# Patient Record
Sex: Male | Born: 1980 | Race: White | Hispanic: No | Marital: Married | Smoking: Never smoker
Health system: Southern US, Community
[De-identification: ages and names within clinical notes are randomized; demographics above are authoritative.]

## PROBLEM LIST (undated history)

## (undated) DIAGNOSIS — R42 Dizziness and giddiness: Principal | ICD-10-CM

## (undated) DIAGNOSIS — I10 Essential (primary) hypertension: Secondary | ICD-10-CM

## (undated) DIAGNOSIS — K219 Gastro-esophageal reflux disease without esophagitis: Secondary | ICD-10-CM

## (undated) DIAGNOSIS — N200 Calculus of kidney: Secondary | ICD-10-CM

## (undated) HISTORY — DX: Calculus of kidney: N20.0

## (undated) HISTORY — PX: OTHER SURGICAL HISTORY: SHX169

## (undated) HISTORY — DX: Essential (primary) hypertension: I10

## (undated) HISTORY — DX: Gastro-esophageal reflux disease without esophagitis: K21.9

## (undated) HISTORY — DX: Dizziness and giddiness: R42

---

## 2003-03-30 ENCOUNTER — Emergency Department (HOSPITAL_COMMUNITY): Admission: EM | Admit: 2003-03-30 | Discharge: 2003-03-31 | Payer: Self-pay | Admitting: Emergency Medicine

## 2003-03-31 ENCOUNTER — Encounter: Payer: Self-pay | Admitting: Emergency Medicine

## 2003-10-20 ENCOUNTER — Ambulatory Visit (HOSPITAL_COMMUNITY): Admission: RE | Admit: 2003-10-20 | Discharge: 2003-10-20 | Payer: Self-pay | Admitting: Urology

## 2013-04-04 ENCOUNTER — Telehealth: Payer: Self-pay | Admitting: Family Medicine

## 2013-04-04 NOTE — Telephone Encounter (Signed)
appt made

## 2013-04-05 ENCOUNTER — Ambulatory Visit (INDEPENDENT_AMBULATORY_CARE_PROVIDER_SITE_OTHER): Payer: BC Managed Care – PPO | Admitting: Family Medicine

## 2013-04-05 ENCOUNTER — Ambulatory Visit (INDEPENDENT_AMBULATORY_CARE_PROVIDER_SITE_OTHER): Payer: BC Managed Care – PPO

## 2013-04-05 VITALS — BP 139/88 | HR 63 | Temp 98.4°F | Wt 223.6 lb

## 2013-04-05 DIAGNOSIS — S4980XA Other specified injuries of shoulder and upper arm, unspecified arm, initial encounter: Secondary | ICD-10-CM

## 2013-04-05 DIAGNOSIS — M25511 Pain in right shoulder: Secondary | ICD-10-CM

## 2013-04-05 DIAGNOSIS — S4991XA Unspecified injury of right shoulder and upper arm, initial encounter: Secondary | ICD-10-CM

## 2013-04-05 DIAGNOSIS — M25519 Pain in unspecified shoulder: Secondary | ICD-10-CM

## 2013-04-05 MED ORDER — IBUPROFEN 600 MG PO TABS: 600.0000 mg | ORAL_TABLET | Freq: Three times a day (TID) | ORAL | Status: DC | PRN

## 2013-04-05 NOTE — Progress Notes (Signed)
  Subjective:    Patient ID: Lutricia Horsfall, male    DOB: [birth year], 32 y.o.   MRN: [id number]  HPI  This 32 y.o. male presents for evaluation of right shoulder discomfort for a month.  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  He denies injury.  Review of Systems C/o right shoulder pain.  No chest pain, SOB, HA, dizziness, vision change, N/V, diarrhea, constipation, dysuria, urinary urgency or frequency, or rash.     Objective:   Physical Exam  Vital signs noted  Well developed well nourished male.  HEENT - Head atraumatic Normocephalic                Respiratory - Lungs CTA bilateral Cardiac - RRR S1 and S2 without murmur MS - Tenderness right shoulder at acromium,  Tenderness with external and internal rotation and FROM and strength right shoulder. Extremities - No edema. Neuro - Grossly intact.  Preliminary right shoulder xray interpretation - Normal right shoulder.      Assessment & Plan:  Shoulder injury, right, initial encounter - Plan: DG Shoulder Right - Motrin 600mg  one po tid x 10 days #30,  Explained he probably has bursitis and rotator cuff tendonitis.  Follow up in 2 weeks if not better and discussed doing circumduction exercises bid.

## 2013-06-20 ENCOUNTER — Ambulatory Visit: Payer: BC Managed Care – PPO | Admitting: Family Medicine

## 2014-12-27 ENCOUNTER — Telehealth: Payer: Self-pay | Admitting: Family Medicine

## 2014-12-27 NOTE — Telephone Encounter (Signed)
Stp advised we don't have any openings for today, can go to urgent care or call back at 7:45 in the morning to see if we have any cancellations. Pt voiced understanding.

## 2014-12-29 ENCOUNTER — Ambulatory Visit (INDEPENDENT_AMBULATORY_CARE_PROVIDER_SITE_OTHER): Payer: BLUE CROSS/BLUE SHIELD | Admitting: Physician Assistant

## 2014-12-29 ENCOUNTER — Encounter: Payer: Self-pay | Admitting: Physician Assistant

## 2014-12-29 VITALS — BP 141/92 | HR 66 | Temp 97.4°F | Ht 67.0 in | Wt 230.6 lb

## 2014-12-29 DIAGNOSIS — K219 Gastro-esophageal reflux disease without esophagitis: Secondary | ICD-10-CM | POA: Diagnosis not present

## 2014-12-29 MED ORDER — OMEPRAZOLE 20 MG PO CPDR: 20.0000 mg | DELAYED_RELEASE_CAPSULE | Freq: Every day | ORAL | Status: DC

## 2014-12-29 MED ORDER — MECLIZINE HCL 32 MG PO TABS: 32.0000 mg | ORAL_TABLET | Freq: Three times a day (TID) | ORAL | Status: DC | PRN

## 2014-12-29 NOTE — Patient Instructions (Signed)
Exercise to Lose Weight Exercise and a healthy diet may help you lose weight. Your doctor may suggest specific exercises. EXERCISE IDEAS AND TIPS  Choose low-cost things you enjoy doing, such as walking, bicycling, or exercising to workout videos.  Take stairs instead of the elevator.  Walk during your lunch break.  Park your car further away from work or school.  Go to a gym or an exercise class.  Start with 5 to 10 minutes of exercise each day. Build up to 30 minutes of exercise 4 to 6 days a week.  Wear shoes with good support and comfortable clothes.  Stretch before and after working out.  Work out until you breathe harder and your heart beats faster.  Drink extra water when you exercise.  Do not do so much that you hurt yourself, feel dizzy, or get very short of breath. Exercises that burn about 150 calories:  Running 1  miles in 15 minutes.  Playing volleyball for 45 to 60 minutes.    MONITOR BLOOD PRESSURE IN AM UPON WAKING AND AT 6 PM IN THE EVENING. WRITE READINGS DOWN AND BRING THEM WITH YOU AT FOLLOW UP. dRINK WATER, NO SOFT DRINKS  IF S/S WORSEN , REPORT TO THE er  Washing and waxing a car for 45 to 60 minutes.  Playing touch football for 45 minutes.  Walking 1  miles in 35 minutes.  Pushing a stroller 1  miles in 30 minutes.  Playing basketball for 30 minutes.  Raking leaves for 30 minutes.  Bicycling 5 miles in 30 minutes.  Walking 2 miles in 30 minutes.  Dancing for 30 minutes.  Shoveling snow for 15 minutes.  Swimming laps for 20 minutes.  Walking up stairs for 15 minutes.  Bicycling 4 miles in 15 minutes.  Gardening for 30 to 45 minutes.  Jumping rope for 15 minutes.  Washing windows or floors for 45 to 60 minutes. Document Released: 11/01/2010 Document Revised: 12/22/2011 Document Reviewed: 11/01/2010 [facility] Patient Information 2015 [place], [place]. This information is not intended to replace advice given to you by your  health care provider. Make sure you discuss any questions you have with your health care provider. Calorie Counting for Weight Loss Calories are energy you get from the things you eat and drink. Your body uses this energy to keep you going throughout the day. The number of calories you eat affects your weight. When you eat more calories than your body needs, your body stores the extra calories as fat. When you eat fewer calories than your body needs, your body burns fat to get the energy it needs. Calorie counting means keeping track of how many calories you eat and drink each day. If you make sure to eat fewer calories than your body needs, you should lose weight. In order for calorie counting to work, you will need to eat the number of calories that are right for you in a day to lose a healthy amount of weight per week. A healthy amount of weight to lose per week is usually 1-2 lb (0.5-0.9 kg). A dietitian can determine how many calories you need in a day and give you suggestions on how to reach your calorie goal.  WHAT IS MY MY PLAN? My goal is to have __________ calories per day.  If I have this many calories per day, I should lose around __________ pounds per week. WHAT DO I NEED TO KNOW ABOUT CALORIE COUNTING? In order to meet your daily calorie goal, you  will need to:  Find out how many calories are in each food you would like to eat. Try to do this before you eat.  Decide how much of the food you can eat.  Write down what you ate and how many calories it had. Doing this is called keeping a food log. WHERE DO I FIND CALORIE INFORMATION? The number of calories in a food can be found on a Nutrition Facts label. Note that all the information on a label is based on a specific serving of the food. If a food does not have a Nutrition Facts label, try to look up the calories online or ask your dietitian for help. HOW DO I DECIDE HOW MUCH TO EAT? To decide how much of the food you can eat, you will  need to consider both the number of calories in one serving and the size of one serving. This information can be found on the Nutrition Facts label. If a food does not have a Nutrition Facts label, look up the information online or ask your dietitian for help. Remember that calories are listed per serving. If you choose to have more than one serving of a food, you will have to multiply the calories per serving by the amount of servings you plan to eat. For example, the label on a package of bread might say that a serving size is 1 slice and that there are 90 calories in a serving. If you eat 1 slice, you will have eaten 90 calories. If you eat 2 slices, you will have eaten 180 calories. HOW DO I KEEP A FOOD LOG? After each meal, record the following information in your food log:  What you ate.  How much of it you ate.  How many calories it had.  Then, add up your calories. Keep your food log near you, such as in a small notebook in your pocket. Another option is to use a mobile app or website. Some programs will calculate calories for you and show you how many calories you have left each time you add an item to the log. WHAT ARE SOME CALORIE COUNTING TIPS?  Use your calories on foods and drinks that will fill you up and not leave you hungry. Some examples of this include foods like nuts and nut butters, vegetables, lean proteins, and high-fiber foods (more than 5 g fiber per serving).  Eat nutritious foods and avoid empty calories. Empty calories are calories you get from foods or beverages that do not have many nutrients, such as candy and soda. It is better to have a nutritious high-calorie food (such as an avocado) than a food with few nutrients (such as a bag of chips).  Know how many calories are in the foods you eat most often. This way, you do not have to look up how many calories they have each time you eat them.  Look out for foods that may seem like low-calorie foods but are really  high-calorie foods, such as baked goods, soda, and fat-free candy.  Pay attention to calories in drinks. Drinks such as sodas, specialty coffee drinks, alcohol, and juices have a lot of calories yet do not fill you up. Choose low-calorie drinks like water and diet drinks.  Focus your calorie counting efforts on higher calorie items. Logging the calories in a garden salad that contains only vegetables is less important than calculating the calories in a milk shake.  Find a way of tracking calories that works for you.  Get creative. Most people who are successful find ways to keep track of how much they eat in a day, even if they do not count every calorie. WHAT ARE SOME PORTION CONTROL TIPS?  Know how many calories are in a serving. This will help you know how many servings of a certain food you can have.  Use a measuring cup to measure serving sizes. This is helpful when you start out. With time, you will be able to estimate serving sizes for some foods.  Take some time to put servings of different foods on your favorite plates, bowls, and cups so you know what a serving looks like.  Try not to eat straight from a bag or box. Doing this can lead to overeating. Put the amount you would like to eat in a cup or on a plate to make sure you are eating the right portion.  Use smaller plates, glasses, and bowls to prevent overeating. This is a quick and easy way to practice portion control. If your plate is smaller, less food can fit on it.  Try not to multitask while eating, such as watching TV or using your computer. If it is time to eat, sit down at a table and enjoy your food. Doing this will help you to start recognizing when you are full. It will also make you more aware of what and how much you are eating. HOW CAN I CALORIE COUNT WHEN EATING OUT?  Ask for smaller portion sizes or child-sized portions.  Consider sharing an entree and sides instead of getting your own entree.  If you get your  own entree, eat only half. Ask for a box at the beginning of your meal and put the rest of your entree in it so you are not tempted to eat it.  Look for the calories on the menu. If calories are listed, choose the lower calorie options.  Choose dishes that include vegetables, fruits, whole grains, low-fat dairy products, and lean protein. Focusing on smart food choices from each of the 5 food groups can help you stay on track at restaurants.  Choose items that are boiled, broiled, grilled, or steamed.  Choose water, milk, unsweetened iced tea, or other drinks without added sugars. If you want an alcoholic beverage, choose a lower calorie option. For example, a regular margarita can have up to 700 calories and a glass of wine has around 150.  Stay away from items that are buttered, battered, fried, or served with cream sauce. Items labeled "crispy" are usually fried, unless stated otherwise.  Ask for dressings, sauces, and syrups on the side. These are usually very high in calories, so do not eat much of them.  Watch out for salads. Many people think salads are a healthy option, but this is often not the case. Many salads come with bacon, fried chicken, lots of cheese, fried chips, and dressing. All of these items have a lot of calories. If you want a salad, choose a garden salad and ask for grilled meats or steak. Ask for the dressing on the side, or ask for olive oil and vinegar or lemon to use as dressing.  Estimate how many servings of a food you are given. For example, a serving of cooked rice is  cup or about the size of half a tennis ball or one cupcake wrapper. Knowing serving sizes will help you be aware of how much food you are eating at restaurants. The list below tells you how big  or small some common portion sizes are based on everyday objects.  1 oz--4 stacked dice.  3 oz--1 deck of cards.  1 tsp--1 dice.  1 Tbsp-- a Ping-Pong ball.  2 Tbsp--1 Ping-Pong ball.   cup--1  tennis ball or 1 cupcake wrapper.  1 cup--1 baseball. Document Released: 09/29/2005 Document Revised: 02/13/2014 Document Reviewed: 08/04/2013 [facility] Patient Information 2015 [place], [place]. This information is not intended to replace advice given to you by your health care provider. Make sure you discuss any questions you have with your health care provider.

## 2014-12-29 NOTE — Progress Notes (Signed)
Subjective:     Patient ID: William Mack, male   DOB: 1981-07-28, 34 y.o.   MRN: [id number]  HPI 34 y/o obese male presetns with c/o light headed and dizziness that began with one episode on Tuesday when walking at work and an episode Wednesday while sitting. Has occurred minimally since those episodes. Went to Urgent care and was diagnosed with slightly elevated hypertension. BP today is borderline hypertensive. He can not relate to whether the dizziness occurs more with sitting to standing or sudden head movements. Family hx of HTN with parents.   Review of Systems  Constitutional: Positive for fatigue.  Respiratory: Negative.   Cardiovascular: Negative.   Neurological: Positive for dizziness and light-headedness. Negative for tremors, seizures, syncope, speech difficulty, weakness and numbness.  Psychiatric/Behavioral: Negative.        Objective:   Physical Exam  Constitutional: He appears well-developed and well-nourished. No distress.  [facility] CBC and CMP were WNL.   obese  HENT:  Right Ear: External ear normal.  Left Ear: External ear normal.  Eyes: Pupils are equal, round, and reactive to light.  Neck: Normal range of motion. Neck supple. No thyromegaly present.  Cardiovascular: Normal rate, regular rhythm and normal heart sounds.  Exam reveals no gallop and no friction rub.   No murmur heard. Pulmonary/Chest: Effort normal and breath sounds normal. No respiratory distress. He has no wheezes. He has no rales. He exhibits no tenderness.  Lymphadenopathy:    He has no cervical adenopathy.  Skin: He is not diaphoretic.  Vitals reviewed.      Assessment:    1. Vertigo 2. Type 1 Hypertension 3. GERD    Plan:     1. Meclizine TID prn for dizziness until f/u in 2 weeks.  2. Borderline hypertensive. Attempt treatment with exercise and weight loss. Replace soft drinks with water. No Gatorade. Grilled foods. Attempt cardio  exercise 20 min daily. Monitor BP at home in am and at 6pm in evening. Recheck in 2 weeks to determine if antihypertensive is needed.  3. Omeprazole 20mg  daily

## 2015-01-15 ENCOUNTER — Ambulatory Visit: Payer: BLUE CROSS/BLUE SHIELD | Admitting: Physician Assistant

## 2015-02-02 ENCOUNTER — Ambulatory Visit: Payer: BLUE CROSS/BLUE SHIELD | Admitting: Physician Assistant

## 2015-02-09 ENCOUNTER — Ambulatory Visit (INDEPENDENT_AMBULATORY_CARE_PROVIDER_SITE_OTHER): Payer: BLUE CROSS/BLUE SHIELD | Admitting: Physician Assistant

## 2015-02-09 ENCOUNTER — Encounter: Payer: Self-pay | Admitting: Physician Assistant

## 2015-02-09 VITALS — BP 143/83 | HR 62 | Temp 97.8°F | Ht 67.0 in | Wt 226.0 lb

## 2015-02-09 DIAGNOSIS — H8110 Benign paroxysmal vertigo, unspecified ear: Secondary | ICD-10-CM

## 2015-02-09 DIAGNOSIS — K219 Gastro-esophageal reflux disease without esophagitis: Secondary | ICD-10-CM | POA: Diagnosis not present

## 2015-02-09 MED ORDER — MECLIZINE HCL 32 MG PO TABS: 32.0000 mg | ORAL_TABLET | Freq: Three times a day (TID) | ORAL | Status: DC | PRN

## 2015-02-09 MED ORDER — OMEPRAZOLE 20 MG PO CPDR: 20.0000 mg | DELAYED_RELEASE_CAPSULE | Freq: Every day | ORAL | Status: DC

## 2015-02-09 NOTE — Patient Instructions (Signed)
Exercise to Lose Weight Exercise and a healthy diet may help you lose weight. Your doctor may suggest specific exercises. EXERCISE IDEAS AND TIPS  Choose low-cost things you enjoy doing, such as walking, bicycling, or exercising to workout videos.  Take stairs instead of the elevator.  Walk during your lunch break.  Park your car further away from work or school.  Go to a gym or an exercise class.  Start with 5 to 10 minutes of exercise each day. Build up to 30 minutes of exercise 4 to 6 days a week.  Wear shoes with good support and comfortable clothes.  Stretch before and after working out.  Work out until you breathe harder and your heart beats faster.  Drink extra water when you exercise.  Do not do so much that you hurt yourself, feel dizzy, or get very short of breath. Exercises that burn about 150 calories:  Running 1  miles in 15 minutes.  Playing volleyball for 45 to 60 minutes.  Washing and waxing a car for 45 to 60 minutes.  Playing touch football for 45 minutes.  Walking 1  miles in 35 minutes.  Pushing a stroller 1  miles in 30 minutes.  Playing basketball for 30 minutes.  Raking leaves for 30 minutes.  Bicycling 5 miles in 30 minutes.  Walking 2 miles in 30 minutes.  Dancing for 30 minutes.  Shoveling snow for 15 minutes.  Swimming laps for 20 minutes.  Walking up stairs for 15 minutes.  Bicycling 4 miles in 15 minutes.  Gardening for 30 to 45 minutes.  Jumping rope for 15 minutes.  Washing windows or floors for 45 to 60 minutes. Document Released: 11/01/2010 Document Revised: 12/22/2011 Document Reviewed: 11/01/2010 [employer] Patient Information 2015 [employer]. This information is not intended to replace advice given to you by your health care provider. Make sure you discuss any questions you have with your health care provider.  

## 2015-02-09 NOTE — Progress Notes (Signed)
   Subjective:    Patient ID: William Mack, male    DOB: 1981/03/22, 34 y.o.   MRN: [id number]  HPI 34 y/o presents for f/u of dizziness and GERD. He states that he's had much improvement with tx of MeclizineBID and Omeprazole daily. He has been taking his BP BID and has records today    Review of Systems  Neurological: Positive for dizziness (decreased episodes, approx once weekly ).  All other systems reviewed and are negative.      Objective:   Physical Exam  Constitutional: He is oriented to person, place, and time. He appears well-developed and well-nourished. No distress.  Obese    HENT:  Head: Normocephalic.  Cardiovascular: Normal rate, regular rhythm, normal heart sounds and intact distal pulses.  Exam reveals no gallop and no friction rub.   No murmur heard. Pulmonary/Chest: Effort normal and breath sounds normal. No respiratory distress. He has no wheezes. He has no rales. He exhibits no tenderness.  Neurological: He is alert and oriented to person, place, and time.  Skin: He is not diaphoretic.  Psychiatric: He has a normal mood and affect. His behavior is normal. Judgment and thought content normal.  Vitals reviewed.         Assessment & Plan:  1. Benign paroxysmal positional vertigo, unspecified laterality  - meclizine (ANTIVERT) 32 MG tablet; Take 1 tablet (32 mg total) by mouth 3 (three) times daily as needed for dizziness.  Dispense: 30 tablet; Refill: 1  2. Gastroesophageal reflux disease without esophagitis  - omeprazole (PRILOSEC) 20 MG capsule; Take 1 capsule (20 mg total) by mouth daily.  Dispense: 30 capsule; Refill: 7  Info given to encourage weight loss and diet to prevent HTN.    Continue all meds Labs pending Health Maintenance reviewed Diet and exercise encouraged   Tiffany A. Chauncey ReadingGann PA-C

## 2015-02-20 ENCOUNTER — Other Ambulatory Visit: Payer: Self-pay | Admitting: Physician Assistant

## 2015-05-10 ENCOUNTER — Other Ambulatory Visit: Payer: Self-pay | Admitting: Physician Assistant

## 2015-05-10 DIAGNOSIS — H8110 Benign paroxysmal vertigo, unspecified ear: Secondary | ICD-10-CM

## 2015-05-10 MED ORDER — MECLIZINE HCL 25 MG PO TABS: 25.0000 mg | ORAL_TABLET | Freq: Three times a day (TID) | ORAL | Status: DC | PRN

## 2015-05-10 NOTE — Telephone Encounter (Signed)
Prescription sent to pharmacy  Raliyah Montella A. Renato Spellman PA-C   

## 2015-06-01 DIAGNOSIS — N200 Calculus of kidney: Secondary | ICD-10-CM

## 2015-06-01 HISTORY — DX: Calculus of kidney: N20.0

## 2015-06-05 ENCOUNTER — Ambulatory Visit (INDEPENDENT_AMBULATORY_CARE_PROVIDER_SITE_OTHER): Payer: BLUE CROSS/BLUE SHIELD | Admitting: Family Medicine

## 2015-06-05 ENCOUNTER — Encounter: Payer: Self-pay | Admitting: Family Medicine

## 2015-06-05 VITALS — BP 152/92 | HR 68 | Temp 98.4°F | Ht 69.0 in | Wt 224.0 lb

## 2015-06-05 DIAGNOSIS — H8110 Benign paroxysmal vertigo, unspecified ear: Secondary | ICD-10-CM | POA: Insufficient documentation

## 2015-06-05 DIAGNOSIS — K219 Gastro-esophageal reflux disease without esophagitis: Secondary | ICD-10-CM | POA: Insufficient documentation

## 2015-06-05 DIAGNOSIS — N200 Calculus of kidney: Secondary | ICD-10-CM

## 2015-06-05 DIAGNOSIS — R319 Hematuria, unspecified: Secondary | ICD-10-CM

## 2015-06-05 LAB — POCT URINALYSIS DIPSTICK
BILIRUBIN UA: NEGATIVE
GLUCOSE UA: NEGATIVE
KETONES UA: NEGATIVE
Leukocytes, UA: NEGATIVE
Nitrite, UA: NEGATIVE
Protein, UA: NEGATIVE
SPEC GRAV UA: 1.01
Urobilinogen, UA: NEGATIVE
pH, UA: 7

## 2015-06-05 LAB — POCT UA - MICROSCOPIC ONLY
BACTERIA, U MICROSCOPIC: NEGATIVE
CASTS, UR, LPF, POC: NEGATIVE
CRYSTALS, UR, HPF, POC: NEGATIVE
EPITHELIAL CELLS, URINE PER MICROSCOPY: NEGATIVE
MUCUS UA: NEGATIVE
RBC, URINE, MICROSCOPIC: NEGATIVE
Yeast, UA: NEGATIVE

## 2015-06-05 NOTE — Patient Instructions (Signed)
Ureteral Colic (Kidney Stones) °Ureteral colic is the result of a condition when kidney stones form inside the kidney. Once kidney stones are formed they may move into the tube that connects the kidney with the bladder (ureter). If this occurs, this condition may cause pain (colic) in the ureter.  °CAUSES  °Pain is caused by stone movement in the ureter and the obstruction caused by the stone. °SYMPTOMS  °The pain comes and goes as the ureter contracts around the stone. The pain is usually intense, sharp, and stabbing in character. The location of the pain may move as the stone moves through the ureter. When the stone is near the kidney the pain is usually located in the back and radiates to the belly (abdomen). When the stone is ready to pass into the bladder the pain is often located in the lower abdomen on the side the stone is located. At this location, the symptoms may mimic those of a urinary tract infection with urinary frequency. Once the stone is located here it often passes into the bladder and the pain disappears completely. °TREATMENT  °· Your caregiver will provide you with medicine for pain relief. °· You may require specialized follow-up X-rays. °· The absence of pain does not always mean that the stone has passed. It may have just stopped moving. If the urine remains completely obstructed, it can cause loss of kidney function or even complete destruction of the involved kidney. It is your responsibility and in your interest that X-rays and follow-ups as suggested by your caregiver are completed. Relief of pain without passage of the stone can be associated with severe damage to the kidney, including loss of kidney function on that side. °· If your stone does not pass on its own, additional measures may be taken by your caregiver to ensure its removal. °HOME CARE INSTRUCTIONS  °· Increase your fluid intake. Water is the preferred fluid since juices containing vitamin C may acidify the urine making it  less likely for certain stones (uric acid stones) to pass. °· Strain all urine. A strainer will be provided. Keep all particulate matter or stones for your caregiver to inspect. °· Take your pain medicine as directed. °· Make a follow-up appointment with your caregiver as directed. °· Remember that the goal is passage of your stone. The absence of pain does not mean the stone is gone. Follow your caregiver's instructions. °· Only take over-the-counter or prescription medicines for pain, discomfort, or fever as directed by your caregiver. °SEEK MEDICAL CARE IF:  °· Pain cannot be controlled with the prescribed medicine. °· You have a fever. °· Pain continues for longer than your caregiver advises it should. °· There is a change in the pain, and you develop chest discomfort or constant abdominal pain. °· You feel faint or pass out. °MAKE SURE YOU:  °· Understand these instructions. °· Will watch your condition. °· Will get help right away if you are not doing well or get worse. °Document Released: 07/09/2005 Document Revised: 01/24/2013 Document Reviewed: 03/26/2011 °[employer]® Patient Information ©2015 [employer]. This information is not intended to replace advice given to you by your health care provider. Make sure you discuss any questions you have with your health care provider. ° °

## 2015-06-05 NOTE — Progress Notes (Signed)
BP 152/92 mmHg  Pulse 68  Temp(Src) 98.4 F (36.9 C) (Oral)  Ht  (1.753 m)  Wt 224 lb (101.606 kg)  BMI 33.06 kg/m2   Subjective:    Patient ID: William Mack, male    DOB: Aug 18, 1981, 34 y.o.   MRN: [id number]  HPI: William Mack is a 34 y.o. male presenting on 06/05/2015 for Hospital followup   HPI Renal stones Patient was at [facility] 3 days ago on Saturday. He was diagnosed with renal stones at that point. They told him he had 2 renal stones largest being 5 mm. He brings in an a cup today 2 small stones in a jar. He had a lot of issues on 02/17/23 and last night, he passed one stone on 2023/02/17 morning and one stone yesterday morning. He has been feeling a lot better today so far. He has increased his fluid intake, and decreased his soda intake. He denies eating much chocolate, denies eating too much red meats. He does have a urology appointment this coming month and was told to bring the stones to the urologist so he will not take the stones and sent for analysis just yet. He is taking Flomax and ibuprofen. Denies any visible hematuria but did have microscopic per lab values. Pain is 2 out of 10 today and was as high as 8 out of 10 last night the day before.  Relevant past medical, surgical, family and social history reviewed and updated as indicated. Interim medical history since our last visit reviewed. Allergies and medications reviewed and updated.  Review of Systems  Constitutional: Negative for fever and chills.  HENT: Negative for ear discharge and ear pain.   Eyes: Negative for discharge and visual disturbance.  Respiratory: Negative for shortness of breath and wheezing.   Cardiovascular: Negative for chest pain and leg swelling.  Gastrointestinal: Positive for nausea (improved today) and abdominal pain. Negative for vomiting, diarrhea, constipation and abdominal distention.  Genitourinary: Positive for hematuria (microscopic but not macroscopic) and  flank pain. Negative for difficulty urinating.  Musculoskeletal: Negative for back pain and gait problem.  Skin: Negative for rash.  Neurological: Negative for syncope, light-headedness and headaches.  All other systems reviewed and are negative.   Per HPI unless specifically indicated above     Medication List       This list is accurate as of: 06/05/15 10:37 AM.  Always use your most recent med list.               ibuprofen 600 MG tablet  Commonly known as:  ADVIL,MOTRIN  Take 1 tablet (600 mg total) by mouth every 8 (eight) hours as needed for pain.     ibuprofen 800 MG tablet  Commonly known as:  ADVIL,MOTRIN  Take 800 mg by mouth every 6 (six) hours as needed.     meclizine 25 MG tablet  Commonly known as:  ANTIVERT  TAKE ONE TABLET BY MOUTH THREE TIMES DAILY AS NEEDED FOR DIZZINESS     omeprazole 20 MG capsule  Commonly known as:  PRILOSEC  Take 1 capsule (20 mg total) by mouth daily.     oxyCODONE-acetaminophen 5-325 MG per tablet  Commonly known as:  PERCOCET/ROXICET  Take 1 tablet by mouth every 6 (six) hours as needed for severe pain.     promethazine 25 MG tablet  Commonly known as:  PHENERGAN  Take 25 mg by mouth every 6 (six) hours as needed for nausea or vomiting.  tamsulosin 0.4 MG Caps capsule  Commonly known as:  FLOMAX  Take 0.4 mg by mouth daily.           Objective:    BP 152/92 mmHg  Pulse 68  Temp(Src) 98.4 F (36.9 C) (Oral)  Ht 5\' 9"  (1.753 m)  Wt 224 lb (101.606 kg)  BMI 33.06 kg/m2  Wt Readings from Last 3 Encounters:  06/05/15 224 lb (101.606 kg)  02/09/15 226 lb (102.513 kg)  12/29/14 230 lb 9.6 oz (104.599 kg)    Physical Exam  Constitutional: He is oriented to person, place, and time. He appears well-developed and well-nourished. No distress.  Eyes: Conjunctivae and EOM are normal. Right eye exhibits no discharge. No scleral icterus.  Cardiovascular: Normal rate, regular rhythm, normal heart sounds and intact distal  pulses.   No murmur heard. Pulmonary/Chest: Effort normal and breath sounds normal. No respiratory distress. He has no wheezes.  Abdominal: Soft. Normal appearance and bowel sounds are normal. He exhibits no distension. There is no hepatosplenomegaly. There is tenderness in the left upper quadrant. There is no rebound, no guarding and no CVA tenderness.  Musculoskeletal: Normal range of motion. He exhibits no edema.  Neurological: He is alert and oriented to person, place, and time. Coordination normal.  Skin: Skin is warm and dry. No rash noted. He is not diaphoretic.  Psychiatric: He has a normal mood and affect. His behavior is normal.  Vitals reviewed.   Results for orders placed or performed in visit on 06/05/15  POCT UA - Microscopic Only  Result Value Ref Range   WBC, Ur, HPF, POC 1-5    RBC, urine, microscopic neg    Bacteria, U Microscopic neg    Mucus, UA neg    Epithelial cells, urine per micros neg    Crystals, Ur, HPF, POC neg    Casts, Ur, LPF, POC neg    Yeast, UA neg   POCT urinalysis dipstick  Result Value Ref Range   Color, UA gold    Clarity, UA clear    Glucose, UA neg    Bilirubin, UA neg    Ketones, UA neg    Spec Grav, UA 1.010    Blood, UA small    pH, UA 7.0    Protein, UA neg    Urobilinogen, UA negative    Nitrite, UA neg    Leukocytes, UA Negative Negative      Assessment & Plan:   Problem List Items Addressed This Visit      Genitourinary   Renal calculus or stone   Relevant Medications   oxyCODONE-acetaminophen (PERCOCET/ROXICET) 5-325 MG per tablet    Other Visit Diagnoses    Hematuria    -  Primary    Relevant Orders    POCT UA - Microscopic Only (Completed)    POCT urinalysis dipstick (Completed)        Follow up plan: Return in about 3 months (around 09/05/2015), or if symptoms worsen or fail to improve.  Arville Care, MD [facility] 06/05/2015, 10:37 AM

## 2015-07-06 ENCOUNTER — Ambulatory Visit (INDEPENDENT_AMBULATORY_CARE_PROVIDER_SITE_OTHER): Payer: BLUE CROSS/BLUE SHIELD | Admitting: Physician Assistant

## 2015-07-06 ENCOUNTER — Encounter: Payer: Self-pay | Admitting: Physician Assistant

## 2015-07-06 VITALS — BP 147/99 | HR 68 | Temp 97.6°F | Ht 69.0 in | Wt 224.0 lb

## 2015-07-06 DIAGNOSIS — I10 Essential (primary) hypertension: Secondary | ICD-10-CM | POA: Diagnosis not present

## 2015-07-06 MED ORDER — LISINOPRIL-HYDROCHLOROTHIAZIDE 10-12.5 MG PO TABS: 1.0000 | ORAL_TABLET | Freq: Every day | ORAL | Status: DC

## 2015-07-06 NOTE — Progress Notes (Signed)
   Subjective:    Patient ID: William Mack, male    DOB: 1980/12/24, 34 y.o.   MRN: [id number]  HPI 34 y/o male presents with c/o dizziness, which has been controlled with Meclizine prn but is not being controlled now. He is also hypertensive and we have discussed putting him on antihypertensive in the past. Last eye exam 1+ year ago. When he initially came in for assessment of dizziness, he has labs from his workplace, which were WNL. He has not had any labs here.    Review of Systems  Constitutional: Negative.   HENT: Negative.   Eyes: Positive for visual disturbance (sometimes feels like his right eye is foggy ).  Respiratory: Negative.   Cardiovascular: Negative.   Gastrointestinal: Negative.   Endocrine: Negative.   Genitourinary: Negative.   Musculoskeletal: Negative.   Skin: Negative.   Allergic/Immunologic: Negative.   Neurological: Positive for dizziness.  Hematological: Negative.   Psychiatric/Behavioral: Negative.        Objective:   Physical Exam  Constitutional: He appears well-developed and well-nourished. No distress.  Cardiovascular: Normal rate.  Exam reveals no gallop and no friction rub.   No murmur heard. Hypertensive   Pulmonary/Chest: Effort normal and breath sounds normal. No respiratory distress. He has no wheezes. He has no rales. He exhibits no tenderness.  Abdominal:  Obese   Skin: He is not diaphoretic.  Psychiatric: He has a normal mood and affect. His behavior is normal. Judgment and thought content normal.          Assessment & Plan:  1. Essential hypertension - Follow up in 1 month for recheck of BP and repeat labs to monitor renal function after starting antihypertensive  - lisinopril-hydrochlorothiazide (PRINZIDE,ZESTORETIC) 10-12.5 MG per tablet; Take 1 tablet by mouth daily.  Dispense: 30 tablet; Refill: 1 - CMP14+EGFR - CBC with Differential/Platelet   RTO 1 month   Tiffany A. Benjamin Stain PA-C

## 2015-07-06 NOTE — Patient Instructions (Signed)
Check and log BP daily on sheet  Call eye dr. For appt

## 2015-07-07 LAB — CMP14+EGFR
ALBUMIN: 4.8 g/dL (ref 3.5–5.5)
ALT: 25 IU/L (ref 0–44)
AST: 20 IU/L (ref 0–40)
Albumin/Globulin Ratio: 2 (ref 1.1–2.5)
Alkaline Phosphatase: 45 IU/L (ref 39–117)
BUN/Creatinine Ratio: 16 (ref 8–19)
BUN: 18 mg/dL (ref 6–20)
Bilirubin Total: 0.3 mg/dL (ref 0.0–1.2)
CALCIUM: 9.7 mg/dL (ref 8.7–10.2)
CO2: 27 mmol/L (ref 18–29)
CREATININE: 1.13 mg/dL (ref 0.76–1.27)
Chloride: 101 mmol/L (ref 97–108)
GFR calc Af Amer: 97 mL/min/{1.73_m2} (ref >59)
GFR, EST NON AFRICAN AMERICAN: 84 mL/min/{1.73_m2} (ref >59)
GLOBULIN, TOTAL: 2.4 g/dL (ref 1.5–4.5)
Glucose: 82 mg/dL (ref 65–99)
Potassium: 4.7 mmol/L (ref 3.5–5.2)
SODIUM: 142 mmol/L (ref 134–144)
TOTAL PROTEIN: 7.2 g/dL (ref 6.0–8.5)

## 2015-07-07 LAB — CBC WITH DIFFERENTIAL/PLATELET
BASOS: 1 %
Basophils Absolute: 0.1 10*3/uL (ref 0.0–0.2)
EOS (ABSOLUTE): 0.2 10*3/uL (ref 0.0–0.4)
EOS: 2 %
HEMATOCRIT: 41 % (ref 37.5–51.0)
Hemoglobin: 13.8 g/dL (ref 12.6–17.7)
IMMATURE GRANS (ABS): 0 10*3/uL (ref 0.0–0.1)
IMMATURE GRANULOCYTES: 0 %
Lymphocytes Absolute: 3 10*3/uL (ref 0.7–3.1)
Lymphs: 33 %
MCH: 31.3 pg (ref 26.6–33.0)
MCHC: 33.7 g/dL (ref 31.5–35.7)
MCV: 93 fL (ref 79–97)
MONOS ABS: 0.5 10*3/uL (ref 0.1–0.9)
Monocytes: 6 %
NEUTROS PCT: 58 %
Neutrophils Absolute: 5.3 10*3/uL (ref 1.4–7.0)
Platelets: 268 10*3/uL (ref 150–379)
RBC: 4.41 x10E6/uL (ref 4.14–5.80)
RDW: 13.7 % (ref 12.3–15.4)
WBC: 9.1 10*3/uL (ref 3.4–10.8)

## 2015-08-06 ENCOUNTER — Encounter: Payer: Self-pay | Admitting: Family Medicine

## 2015-08-06 ENCOUNTER — Ambulatory Visit (INDEPENDENT_AMBULATORY_CARE_PROVIDER_SITE_OTHER): Payer: BLUE CROSS/BLUE SHIELD | Admitting: Family Medicine

## 2015-08-06 VITALS — BP 133/89 | HR 68 | Temp 98.6°F | Ht 69.0 in | Wt 228.2 lb

## 2015-08-06 DIAGNOSIS — I10 Essential (primary) hypertension: Secondary | ICD-10-CM | POA: Diagnosis not present

## 2015-08-06 DIAGNOSIS — R42 Dizziness and giddiness: Secondary | ICD-10-CM

## 2015-08-06 MED ORDER — LISINOPRIL-HYDROCHLOROTHIAZIDE 10-12.5 MG PO TABS: 1.0000 | ORAL_TABLET | Freq: Every day | ORAL | Status: DC

## 2015-08-06 NOTE — Progress Notes (Signed)
BP 133/89 mmHg  Pulse 68  Temp(Src) 98.6 F (37 C) (Oral)  Ht '5\' 9"'  (1.753 m)  Wt 228 lb 3.2 oz (103.511 kg)  BMI 33.68 kg/m2   Subjective:    Patient ID: William Mack, male    DOB: [birth year], 34 y.o.   MRN: [id number]  HPI: William Mack is a 34 y.o. male presenting on 08/06/2015 for Hypertension and Dizziness   HPI Hypertension Patient presents today for hypertension recheck. He has been having readings from home and from here that all show his usually in the 130s over 80 range. He had one reading at home that was slightly above 140 but the rest were all less than that. He had no readings over diastolic of 90. Patient denies headaches, blurred vision, chest pains, shortness of breath, or weakness. Denies any side effects from medication and is content with current medication.  Dizziness He has been having dizziness over the past month, but this is been improving and happening less frequently over the last 2 weeks. He takes meclizine for vertigo but that has been something that he had controlled for years. The dizziness that he is describing now is more of a lightheadedness and feeling faint the last a few seconds and then goes away. Initially 4 weeks ago he was having it almost every day and now is having it once or twice a week.  Relevant past medical, surgical, family and social history reviewed and updated as indicated. Interim medical history since our last visit reviewed. Allergies and medications reviewed and updated.  Review of Systems  Constitutional: Negative for fever and chills.  HENT: Negative for congestion, ear discharge and ear pain.   Eyes: Negative for discharge and visual disturbance.  Respiratory: Negative for shortness of breath and wheezing.   Cardiovascular: Negative for chest pain and leg swelling.  Gastrointestinal: Negative for abdominal pain, diarrhea and constipation.  Genitourinary: Negative for difficulty urinating.  Musculoskeletal:  Negative for back pain and gait problem.  Skin: Negative for rash.  Neurological: Positive for dizziness and light-headedness. Negative for syncope, facial asymmetry, weakness, numbness and headaches.  All other systems reviewed and are negative.   Per HPI unless specifically indicated above     Medication List       This list is accurate as of: 08/06/15  5:12 PM.  Always use your most recent med list.               lisinopril-hydrochlorothiazide 10-12.5 MG tablet  Commonly known as:  PRINZIDE,ZESTORETIC  Take 1 tablet by mouth daily.     meclizine 25 MG tablet  Commonly known as:  ANTIVERT  TAKE ONE TABLET BY MOUTH THREE TIMES DAILY AS NEEDED FOR DIZZINESS     omeprazole 20 MG capsule  Commonly known as:  PRILOSEC  Take 1 capsule (20 mg total) by mouth daily.           Objective:    BP 133/89 mmHg  Pulse 68  Temp(Src) 98.6 F (37 C) (Oral)  Ht '5\' 9"'  (1.753 m)  Wt 228 lb 3.2 oz (103.511 kg)  BMI 33.68 kg/m2  Wt Readings from Last 3 Encounters:  08/06/15 228 lb 3.2 oz (103.511 kg)  07/06/15 224 lb (101.606 kg)  06/05/15 224 lb (101.606 kg)    Physical Exam  Constitutional: He is oriented to person, place, and time. He appears well-developed and well-nourished. No distress.  Eyes: Conjunctivae and EOM are normal. Pupils are equal, round, and reactive to light.  Right eye exhibits no discharge. No scleral icterus.  Cardiovascular: Normal rate, regular rhythm, normal heart sounds and intact distal pulses.   No murmur heard. Pulmonary/Chest: Effort normal and breath sounds normal. No respiratory distress. He has no wheezes.  Abdominal: He exhibits no distension.  Musculoskeletal: Normal range of motion. He exhibits no edema.  Neurological: He is alert and oriented to person, place, and time. Coordination normal.  Skin: Skin is warm and dry. No rash noted. He is not diaphoretic.  Psychiatric: He has a normal mood and affect. His behavior is normal.  Vitals  reviewed.   Results for orders placed or performed in visit on 07/06/15  CMP14+EGFR  Result Value Ref Range   Glucose 82 65 - 99 mg/dL   BUN 18 6 - 20 mg/dL   Creatinine, Ser 1.13 0.76 - 1.27 mg/dL   GFR calc non Af Amer 84 >59 mL/min/1.73   GFR calc Af Amer 97 >59 mL/min/1.73   BUN/Creatinine Ratio 16 8 - 19   Sodium 142 134 - 144 mmol/L   Potassium 4.7 3.5 - 5.2 mmol/L   Chloride 101 97 - 108 mmol/L   CO2 27 18 - 29 mmol/L   Calcium 9.7 8.7 - 10.2 mg/dL   Total Protein 7.2 6.0 - 8.5 g/dL   Albumin 4.8 3.5 - 5.5 g/dL   Globulin, Total 2.4 1.5 - 4.5 g/dL   Albumin/Globulin Ratio 2.0 1.1 - 2.5   Bilirubin Total 0.3 0.0 - 1.2 mg/dL   Alkaline Phosphatase 45 39 - 117 IU/L   AST 20 0 - 40 IU/L   ALT 25 0 - 44 IU/L  CBC with Differential/Platelet  Result Value Ref Range   WBC 9.1 3.4 - 10.8 x10E3/uL   RBC 4.41 4.14 - 5.80 x10E6/uL   Hemoglobin 13.8 12.6 - 17.7 g/dL   Hematocrit 41.0 37.5 - 51.0 %   MCV 93 79 - 97 fL   MCH 31.3 26.6 - 33.0 pg   MCHC 33.7 31.5 - 35.7 g/dL   RDW 13.7 12.3 - 15.4 %   Platelets 268 150 - 379 x10E3/uL   Neutrophils 58 %   Lymphs 33 %   Monocytes 6 %   Eos 2 %   Basos 1 %   Neutrophils Absolute 5.3 1.4 - 7.0 x10E3/uL   Lymphocytes Absolute 3.0 0.7 - 3.1 x10E3/uL   Monocytes Absolute 0.5 0.1 - 0.9 x10E3/uL   EOS (ABSOLUTE) 0.2 0.0 - 0.4 x10E3/uL   Basophils Absolute 0.1 0.0 - 0.2 x10E3/uL   Immature Granulocytes 0 %   Immature Grans (Abs) 0.0 0.0 - 0.1 x10E3/uL   EKG: Normal sinus rhythm with no abnormalities.    Assessment & Plan:   Problem List Items Addressed This Visit    None    Visit Diagnoses    Essential hypertension    -  Primary    Relevant Medications    lisinopril-hydrochlorothiazide (PRINZIDE,ZESTORETIC) 10-12.5 MG tablet    Other Relevant Orders    EKG 12-Lead    Dizziness        He gets occasional dizziness and lightheadedness but it has been occurring less frequently over the past couple weeks than it was a month ago.  We'll do EKG    Relevant Orders    EKG 12-Lead        Follow up plan: Return in about 6 months (around 02/04/2016), or if symptoms worsen or fail to improve, for Hypertension recheck.  Caryl Pina, MD [facility] 08/06/2015,  5:12 PM

## 2015-08-06 NOTE — Assessment & Plan Note (Signed)
Continues to take lisinopril hydrochlorothiazide 10-12.5. His home blood pressures have been in the 130s over 80 range for the most part. He brings a written chart of it. Here today his blood pressure is 133/89.

## 2015-09-27 ENCOUNTER — Encounter: Payer: Self-pay | Admitting: Family Medicine

## 2015-09-27 ENCOUNTER — Ambulatory Visit (INDEPENDENT_AMBULATORY_CARE_PROVIDER_SITE_OTHER): Payer: BLUE CROSS/BLUE SHIELD | Admitting: Family Medicine

## 2015-09-27 VITALS — BP 134/84 | HR 73 | Temp 97.5°F | Ht 69.0 in | Wt 227.0 lb

## 2015-09-27 DIAGNOSIS — R55 Syncope and collapse: Secondary | ICD-10-CM

## 2015-09-27 DIAGNOSIS — K219 Gastro-esophageal reflux disease without esophagitis: Secondary | ICD-10-CM

## 2015-09-27 DIAGNOSIS — R42 Dizziness and giddiness: Secondary | ICD-10-CM | POA: Diagnosis not present

## 2015-09-27 DIAGNOSIS — I10 Essential (primary) hypertension: Secondary | ICD-10-CM

## 2015-09-27 NOTE — Progress Notes (Signed)
Subjective:    Patient ID: William Mack, male    DOB: 10/19/1980, 34 y.o.   MRN: [id number]  HPI Patient here today for dizziness. This dizziness occurs a few times weekly and he does have occasional headaches. At the last visit he did have an EKG that was normal. His had recent blood work back in the summer that had a normal CMP and CBC. He does take medication for his blood pressure and for his GERD. He does have Antivert at home which he takes also. There is a family history of hypertension and his father has had a heart attack. The patient says his vision is okay and he has had this checked by the optometrist. Headaches are not associated with the dizziness. The episodes come on after he has this blank feeling in his head and then he has the dizziness. He says that mobile findings aggravate the dizziness. He has been having episodes of dizziness for some 6 months or more and as many as 2 times a week lasting from a few minutes to a few hours. He passed out on one episode around November 18 when he was working under the hood of the car. He does say that he drinks a lot of caffeine maybe 2 drinks a day and tea. He says squatting and standing may aggravate the dizziness. His blood pressures at home run in the 130s over the 80s. He takes a meclizine every day on a regular basis 25 mg.     Patient Active Problem List   Diagnosis Date Noted  . Essential hypertension 08/06/2015  . Renal calculus or stone 06/05/2015  . GERD (gastroesophageal reflux disease) 06/05/2015  . Benign paroxysmal positional vertigo 06/05/2015   Outpatient Encounter Prescriptions as of 09/27/2015  Medication Sig  . lisinopril-hydrochlorothiazide (PRINZIDE,ZESTORETIC) 10-12.5 MG tablet Take 1 tablet by mouth daily.  . meclizine (ANTIVERT) 25 MG tablet TAKE ONE TABLET BY MOUTH THREE TIMES DAILY AS NEEDED FOR DIZZINESS  . omeprazole (PRILOSEC) 20 MG capsule Take 1 capsule (20 mg total) by mouth daily.   No  facility-administered encounter medications on file as of 09/27/2015.      Review of Systems  Constitutional: Negative.   Eyes: Negative.   Respiratory: Negative.   Cardiovascular: Negative.   Gastrointestinal: Negative.   Endocrine: Negative.   Genitourinary: Negative.   Musculoskeletal: Negative.   Skin: Negative.   Allergic/Immunologic: Negative.   Neurological: Positive for dizziness (few times per week) and headaches (occasional).  Hematological: Negative.   Psychiatric/Behavioral: Negative.        Objective:   Physical Exam  Constitutional: He is oriented to person, place, and time. He appears well-developed and well-nourished. No distress.  HENT:  Head: Normocephalic and atraumatic.  Right Ear: External ear normal.  Left Ear: External ear normal.  Mouth/Throat: Oropharynx is clear and moist. No oropharyngeal exudate.  Slight nasal congestion  Eyes: Conjunctivae and EOM are normal. Pupils are equal, round, and reactive to light. Right eye exhibits no discharge. Left eye exhibits no discharge. No scleral icterus.  Description are bilaterally and pupils are equal round and reactive to light.  Neck: Normal range of motion. Neck supple. No thyromegaly present.  No bruits anterior cervical adenopathy  Cardiovascular: Normal rate, regular rhythm, normal heart sounds and intact distal pulses.  Exam reveals no gallop and no friction rub.   No murmur heard. Heart is regular at 72/m  Pulmonary/Chest: Effort normal and breath sounds normal. No respiratory distress. He has no wheezes.  He has no rales. He exhibits no tenderness.  Clear anteriorly and posteriorly  Abdominal: Soft. Bowel sounds are normal. He exhibits no mass. There is no tenderness. There is no rebound and no guarding.  No abdominal tenderness or organ enlargement or masses or inguinal adenopathy  Musculoskeletal: Normal range of motion. He exhibits no edema.  Lymphadenopathy:    He has no cervical adenopathy.    Neurological: He is alert and oriented to person, place, and time. He has normal reflexes. No cranial nerve deficit.  Skin: Skin is warm and dry. No rash noted.  Psychiatric: He has a normal mood and affect. His behavior is normal. Judgment and thought content normal.  Nursing note and vitals reviewed.  BP 134/84 mmHg  Pulse 73  Temp(Src) 97.5 F (36.4 C) (Oral)  Ht '5\' 9"'  (1.753 m)  Wt 227 lb (102.967 kg)  BMI 33.51 kg/m2        Assessment & Plan:  1. Dizziness -We will do the [facility] monitor and schedule an echocardiogram and get a referral to the neurologist for further evaluation -We will review lab work with patient once it is returned - BMP8+EGFR - Thyroid Panel With TSH - Cardiac event monitor - Ambulatory referral to Neurology - CBC with Differential/Platelet - Ambulatory referral to Cardiology  2. Syncope, unspecified syncope type -The patient should avoid climbing - Cardiac event monitor - Ambulatory referral to Neurology - CBC with Differential/Platelet - Ambulatory referral to Cardiology  3. Essential hypertension -Monitor blood pressures more closely as directed  4. Gastroesophageal reflux disease without esophagitis -Continue omeprazole for stomach and reflux  Patient Instructions  Eliminate all caffeine Drink more water Monitor blood pressures and if possible get a blood pressure at the time when you're feeling dizzy to see if it is any lower or higher at that time Keep a diary Try to see if the episodes of dizziness correlate with anything in particular that you're doing or eating or any time of the day With the event monitor for your heart be sure and keep a diary with that so that we can see if any dizziness is correlated with any heart irregularity Don't climb   Arrie Senate MD

## 2015-09-27 NOTE — Patient Instructions (Signed)
Eliminate all caffeine Drink more water Monitor blood pressures and if possible get a blood pressure at the time when you're feeling dizzy to see if it is any lower or higher at that time Keep a diary Try to see if the episodes of dizziness correlate with anything in particular that you're doing or eating or any time of the day With the event monitor for your heart be sure and keep a diary with that so that we can see if any dizziness is correlated with any heart irregularity Don't climb

## 2015-09-28 LAB — THYROID PANEL WITH TSH
FREE THYROXINE INDEX: 2.1 (ref 1.2–4.9)
T3 UPTAKE RATIO: 24 % (ref 24–39)
T4, Total: 8.9 ug/dL (ref 4.5–12.0)
TSH: 2.81 u[IU]/mL (ref 0.450–4.500)

## 2015-09-28 LAB — CBC WITH DIFFERENTIAL/PLATELET
BASOS ABS: 0.1 10*3/uL (ref 0.0–0.2)
Basos: 1 %
EOS (ABSOLUTE): 0.1 10*3/uL (ref 0.0–0.4)
Eos: 1 %
Hematocrit: 42.4 % (ref 37.5–51.0)
Hemoglobin: 14.2 g/dL (ref 12.6–17.7)
Immature Grans (Abs): 0 10*3/uL (ref 0.0–0.1)
Immature Granulocytes: 0 %
LYMPHS ABS: 3.7 10*3/uL — AB (ref 0.7–3.1)
Lymphs: 37 %
MCH: 31.4 pg (ref 26.6–33.0)
MCHC: 33.5 g/dL (ref 31.5–35.7)
MCV: 94 fL (ref 79–97)
Monocytes Absolute: 0.5 10*3/uL (ref 0.1–0.9)
Monocytes: 5 %
NEUTROS ABS: 5.5 10*3/uL (ref 1.4–7.0)
Neutrophils: 56 %
PLATELETS: 338 10*3/uL (ref 150–379)
RBC: 4.52 x10E6/uL (ref 4.14–5.80)
RDW: 13.6 % (ref 12.3–15.4)
WBC: 9.9 10*3/uL (ref 3.4–10.8)

## 2015-09-28 LAB — BMP8+EGFR
BUN/Creatinine Ratio: 18 (ref 8–19)
BUN: 19 mg/dL (ref 6–20)
CALCIUM: 9.6 mg/dL (ref 8.7–10.2)
CHLORIDE: 97 mmol/L (ref 96–106)
CO2: 26 mmol/L (ref 18–29)
Creatinine, Ser: 1.04 mg/dL (ref 0.76–1.27)
GFR calc Af Amer: 107 mL/min/{1.73_m2} (ref >59)
GFR calc non Af Amer: 93 mL/min/{1.73_m2} (ref >59)
GLUCOSE: 70 mg/dL (ref 65–99)
Potassium: 3.9 mmol/L (ref 3.5–5.2)
Sodium: 140 mmol/L (ref 134–144)

## 2015-10-30 ENCOUNTER — Encounter: Payer: Self-pay | Admitting: Family Medicine

## 2015-10-30 ENCOUNTER — Ambulatory Visit (INDEPENDENT_AMBULATORY_CARE_PROVIDER_SITE_OTHER): Payer: BLUE CROSS/BLUE SHIELD | Admitting: Family Medicine

## 2015-10-30 VITALS — BP 125/82 | HR 67 | Temp 97.0°F | Ht 69.0 in | Wt 230.0 lb

## 2015-10-30 DIAGNOSIS — R42 Dizziness and giddiness: Secondary | ICD-10-CM | POA: Diagnosis not present

## 2015-10-30 DIAGNOSIS — I10 Essential (primary) hypertension: Secondary | ICD-10-CM

## 2015-10-30 MED ORDER — MECLIZINE HCL 25 MG PO TABS: ORAL_TABLET | ORAL | Status: DC

## 2015-10-30 MED ORDER — LISINOPRIL-HYDROCHLOROTHIAZIDE 10-12.5 MG PO TABS: 1.0000 | ORAL_TABLET | Freq: Every day | ORAL | Status: DC

## 2015-10-30 MED ORDER — FLUTICASONE PROPIONATE 50 MCG/ACT NA SUSP: 2.0000 | Freq: Every day | NASAL | Status: DC

## 2015-10-30 NOTE — Patient Instructions (Signed)
Continue to work on the weight Continue to avoid caffeine Follow-up with cardiology and neurology as planned Because of the family history the patient will come back in 3-4 months and get a lipid profile along with liver function test

## 2015-10-30 NOTE — Progress Notes (Signed)
Subjective:    Patient ID: William Mack, male    DOB: January 12, 1981, 35 y.o.   MRN: [id number]  HPI Patient here today for 4 week follow up on dizziness, syncope and HTN. He has cut out caffeine since last appt and he has follow up appointments with cardiology and neurology. The [facility] monitor revealed some PVCs and some bradycardia. These were not frequent in nature. He has reduced his caffeine intake. All lab work done on the patient initially which included CBC thyroid profile and BMP were all within normal limits. The patient is doing some better compared to previously and reduce the caffeine almost immediately. He is not having as much dizziness or palpitations. We will still follow through with seeing the cardiologist and the neurologist. The family history is significant in that both his parents have a history of hypertension and his father has had heart disease.     Patient Active Problem List   Diagnosis Date Noted  . Essential hypertension 08/06/2015  . Renal calculus or stone 06/05/2015  . GERD (gastroesophageal reflux disease) 06/05/2015  . Benign paroxysmal positional vertigo 06/05/2015   Outpatient Encounter Prescriptions as of 10/30/2015  Medication Sig  . lisinopril-hydrochlorothiazide (PRINZIDE,ZESTORETIC) 10-12.5 MG tablet Take 1 tablet by mouth daily.  Marland Kitchen omeprazole (PRILOSEC) 20 MG capsule Take 1 capsule (20 mg total) by mouth daily.  . meclizine (ANTIVERT) 25 MG tablet TAKE ONE TABLET BY MOUTH THREE TIMES DAILY AS NEEDED FOR DIZZINESS (Patient not taking: Reported on 10/30/2015)   No facility-administered encounter medications on file as of 10/30/2015.     Review of Systems  Constitutional: Negative.   HENT: Negative.   Eyes: Negative.   Respiratory: Negative.   Cardiovascular: Negative.  Negative for palpitations.  Gastrointestinal: Negative.   Endocrine: Negative.   Genitourinary: Negative.   Musculoskeletal: Negative.   Skin: Negative.     Allergic/Immunologic: Negative.   Neurological: Positive for dizziness (comes and goes).  Hematological: Negative.   Psychiatric/Behavioral: Negative.        Objective:   Physical Exam  Constitutional: He is oriented to person, place, and time. He appears well-developed and well-nourished. No distress.  HENT:  Head: Normocephalic and atraumatic.  Right Ear: External ear normal.  Left Ear: External ear normal.  Mouth/Throat: Oropharynx is clear and moist. No oropharyngeal exudate.  Nasal congestion bilaterally and turbinate swelling  Eyes: Conjunctivae and EOM are normal. Pupils are equal, round, and reactive to light. Right eye exhibits no discharge. Left eye exhibits no discharge. No scleral icterus.  Neck: Normal range of motion. Neck supple. No thyromegaly present.  Cardiovascular: Normal rate, regular rhythm and normal heart sounds.  Exam reveals no friction rub.   No murmur heard. The heart has a regular rate and rhythm at 60/m  Pulmonary/Chest: Effort normal and breath sounds normal. No respiratory distress. He has no wheezes. He has no rales. He exhibits no tenderness.  Clear anteriorly and posteriorly  Abdominal: Soft. Bowel sounds are normal. He exhibits no mass. There is no tenderness. There is no rebound and no guarding.  The abdomen is obese without masses tenderness or organ enlargement and no bruits  Musculoskeletal: Normal range of motion. He exhibits no edema.  Lymphadenopathy:    He has no cervical adenopathy.  Neurological: He is alert and oriented to person, place, and time.  Skin: Skin is warm and dry. No rash noted.  Psychiatric: He has a normal mood and affect. His behavior is normal. Judgment and thought content  normal.  Nursing note and vitals reviewed.  BP 125/82 mmHg  Pulse 67  Temp(Src) 97 F (36.1 C) (Oral)  Ht  (1.753 m)  Wt 230 lb (104.327 kg)  BMI 33.95 kg/m2        Assessment & Plan:  1. Essential hypertension -The blood pressure  is good today and he will continue with current treatment - lisinopril-hydrochlorothiazide (PRINZIDE,ZESTORETIC) 10-12.5 MG tablet; Take 1 tablet by mouth daily.  Dispense: 30 tablet; Refill: 5  2. Dizziness -He is still having some episodes of dizziness off and on but this seems to be improved.  Meds ordered this encounter  Medications  . lisinopril-hydrochlorothiazide (PRINZIDE,ZESTORETIC) 10-12.5 MG tablet    Sig: Take 1 tablet by mouth daily.    Dispense:  30 tablet    Refill:  5  . meclizine (ANTIVERT) 25 MG tablet    Sig: TAKE ONE TABLET BY MOUTH THREE TIMES DAILY AS NEEDED FOR DIZZINESS    Dispense:  30 tablet    Refill:  1  . fluticasone (FLONASE) 50 MCG/ACT nasal spray    Sig: Place 2 sprays into both nostrils daily.    Dispense:  16 g    Refill:  6   Patient Instructions  Continue to work on the weight Continue to avoid caffeine Follow-up with cardiology and neurology as planned Because of the family history the patient will come back in 3-4 months and get a lipid profile along with liver function test    Nyra Capes MD

## 2015-11-01 ENCOUNTER — Encounter: Payer: Self-pay | Admitting: Neurology

## 2015-11-01 ENCOUNTER — Ambulatory Visit (INDEPENDENT_AMBULATORY_CARE_PROVIDER_SITE_OTHER): Payer: BLUE CROSS/BLUE SHIELD | Admitting: Neurology

## 2015-11-01 VITALS — BP 141/88 | HR 63 | Ht 67.0 in | Wt 230.0 lb

## 2015-11-01 DIAGNOSIS — R42 Dizziness and giddiness: Secondary | ICD-10-CM | POA: Diagnosis not present

## 2015-11-01 DIAGNOSIS — R55 Syncope and collapse: Secondary | ICD-10-CM

## 2015-11-01 HISTORY — DX: Dizziness and giddiness: R42

## 2015-11-01 NOTE — Progress Notes (Signed)
Reason for visit: Dizziness   Referring physician:  Dr. Helane Rima is a 35 y.o. male  History of present illness:   Mr. Wix is a 35 year old right-handed white male with a 6-7 month history of episodic dizziness. The patient indicates they will have a sensation of tingling on the top of the head and then develope a lightheaded, floaty feeling. The patient does not describe true vertigo. He indicates that the episodes last anywhere from 2 minutes to up to 10 minutes, and then resolve without any residual defects. The patient does not feel wiped out afterwards. The patient indicates that he may have episodes while sitting or standing. He indicates that he may have events while looking at his telephone. If he stands still for a period of time, the episode will resolve. The patient reports no nausea, blanching of the face, clamminess, chest pain, palpitations of the heart, shortness of breath, sensation of anxiety, or problems with headache during the events. The patient reports no problems with hearing changes, ringing in the ears, or ear pain. He indicates that he was given meclizine, this did seem to help the dizziness some. Over time, he believes that episodes are becoming more frequent. He did wear a cardiac monitor that did not show any definite etiology for the episodes. The patient reports no numbness or weakness of extremities. He denies balance issues. He did have one episode of syncope that occurred on 09/01/2015. The patient had cut his left thumb, he had difficulty stopping the bleeding. He then had a sensation that he needed to have a bowel movement, but he blacked out before he got into the bathroom. During that episode, the mother noted that he did seem to be blanched in the face. The patient has been set up to see cardiology.  Past Medical History  Diagnosis Date  . Kidney stones 06/01/2015  . Hypertension   . GERD (gastroesophageal reflux disease)   . Dizziness  and giddiness 11/01/2015    History reviewed. No pertinent past surgical history.  Family History  Problem Relation Age of Onset  . Hypertension Mother   . Heart attack Father   . Hypertension Father   . Hypertension Brother     Social history:  reports that he has never smoked. He has never used smokeless tobacco. He reports that he does not drink alcohol or use illicit drugs.  Medications:  Prior to Admission medications   Medication Sig Start Date End Date Taking? Authorizing Provider  fluticasone (FLONASE) 50 MCG/ACT nasal spray Place 2 sprays into both nostrils daily. 10/30/15  Yes Ernestina Penna, MD  lisinopril-hydrochlorothiazide (PRINZIDE,ZESTORETIC) 10-12.5 MG tablet Take 1 tablet by mouth daily. 10/30/15  Yes Ernestina Penna, MD  meclizine (ANTIVERT) 25 MG tablet TAKE ONE TABLET BY MOUTH THREE TIMES DAILY AS NEEDED FOR DIZZINESS 10/30/15  Yes Ernestina Penna, MD  omeprazole (PRILOSEC) 20 MG capsule Take 1 capsule (20 mg total) by mouth daily. 02/09/15  Yes Tiffany A Gann, PA-C     No Known Allergies  ROS:  Out of a complete 14 system review of symptoms, the patient complains only of the following symptoms, and all other reviewed systems are negative.   Dizziness  Blood pressure 141/88, pulse 63, height  (1.702 m), weight 230 lb (104.327 kg).    blood pressure, right arm, standing is 144/90. Blood pressure, sitting, right arm is 148/84.  Physical Exam  General: The patient is alert and cooperative at the  time of the examination. The patient is moderately obese.  Eyes: Pupils are equal, round, and reactive to light. Discs are flat bilaterally.  Neck: The neck is supple, no carotid bruits are noted.  Respiratory: The respiratory examination is clear.  Cardiovascular: The cardiovascular examination reveals a regular rate and rhythm, no obvious murmurs or rubs are noted.  Skin: Extremities are without significant edema.  Neurologic Exam  Mental status: The  patient is alert and oriented x 3 at the time of the examination. The patient has apparent normal recent and remote memory, with an apparently normal attention span and concentration ability.  Cranial nerves: Facial symmetry is present. There is good sensation of the face to pinprick and soft touch bilaterally. The strength of the facial muscles and the muscles to head turning and shoulder shrug are normal bilaterally. Speech is well enunciated, no aphasia or dysarthria is noted. Extraocular movements are full. Visual fields are full. The tongue is midline, and the patient has symmetric elevation of the soft palate. No obvious hearing deficits are noted.  Motor: The motor testing reveals 5 over 5 strength of all 4 extremities. Good symmetric motor tone is noted throughout.  Sensory: Sensory testing is intact to pinprick, soft touch, vibration sensation, and position sense on all 4 extremities. No evidence of extinction is noted.  Coordination: Cerebellar testing reveals good finger-nose-finger and heel-to-shin bilaterally.  Gait and station: Gait is normal. Tandem gait is normal. Romberg is negative. No drift is seen.  Reflexes: Deep tendon reflexes are symmetric and normal bilaterally. Toes are downgoing bilaterally.   Assessment/Plan:   1. Episodic dizziness   2. Syncope, likely vasovagal syncope   The patient has reports of relatively frequent episodes of dizziness. He is on blood pressure medications, but this was started after the symptoms began. The clinical examination today is currently normal. The etiology of the dizziness is not clear. The patient will be set up for a carotid Doppler study, and MRI evaluation of the brain. We may consider EEG evaluation in the future. The patient will be seen by cardiology in th near future.  Marlan Palau MD 11/01/2015 8:51 PM  [facility] [address] [place], [place] [address]  Phone [phone] Fax  [phone]

## 2015-11-01 NOTE — Patient Instructions (Signed)
   We will check a carotid doppler study and get MRI evaluation of the brain.  Dizziness Dizziness is a common problem. It is a feeling of unsteadiness or light-headedness. You may feel like you are about to faint. Dizziness can lead to injury if you stumble or fall. Anyone can become dizzy, but dizziness is more common in older adults. This condition can be caused by a number of things, including medicines, dehydration, or illness. HOME CARE INSTRUCTIONS Taking these steps may help with your condition: Eating and Drinking  Drink enough fluid to keep your urine clear or pale yellow. This helps to keep you from becoming dehydrated. Try to drink more clear fluids, such as water.  Do not drink alcohol.  Limit your caffeine intake if directed by your health care provider.  Limit your salt intake if directed by your health care provider. Activity  Avoid making quick movements.  Rise slowly from chairs and steady yourself until you feel okay.  In the morning, first sit up on the side of the bed. When you feel okay, stand slowly while you hold onto something until you know that your balance is fine.  Move your legs often if you need to stand in one place for a long time. Tighten and relax your muscles in your legs while you are standing.  Do not drive or operate heavy machinery if you feel dizzy.  Avoid bending down if you feel dizzy. Place items in your home so that they are easy for you to reach without leaning over. Lifestyle  Do not use any tobacco products, including cigarettes, chewing tobacco, or electronic cigarettes. If you need help quitting, ask your health care provider.  Try to reduce your stress level, such as with yoga or meditation. Talk with your health care provider if you need help. General Instructions  Watch your dizziness for any changes.  Take medicines only as directed by your health care provider. Talk with your health care provider if you think that your  dizziness is caused by a medicine that you are taking.  Tell a friend or a family member that you are feeling dizzy. If he or she notices any changes in your behavior, have this person call your health care provider.  Keep all follow-up visits as directed by your health care provider. This is important. SEEK MEDICAL CARE IF:  Your dizziness does not go away.  Your dizziness or light-headedness gets worse.  You feel nauseous.  You have reduced hearing.  You have new symptoms.  You are unsteady on your feet or you feel like the room is spinning. SEEK IMMEDIATE MEDICAL CARE IF:  You vomit or have diarrhea and are unable to eat or drink anything.  You have problems talking, walking, swallowing, or using your arms, hands, or legs.  You feel generally weak.  You are not thinking clearly or you have trouble forming sentences. It may take a friend or family member to notice this.  You have chest pain, abdominal pain, shortness of breath, or sweating.  Your vision changes.  You notice any bleeding.  You have a headache.  You have neck pain or a stiff neck.  You have a fever.   This information is not intended to replace advice given to you by your health care provider. Make sure you discuss any questions you have with your health care provider.   Document Released: 03/25/2001 Document Revised: 02/13/2015 Document Reviewed: 09/25/2014 Elsevier Interactive Patient Education [employer]

## 2015-11-14 ENCOUNTER — Ambulatory Visit (INDEPENDENT_AMBULATORY_CARE_PROVIDER_SITE_OTHER): Payer: BLUE CROSS/BLUE SHIELD

## 2015-11-14 DIAGNOSIS — R42 Dizziness and giddiness: Secondary | ICD-10-CM | POA: Diagnosis not present

## 2015-11-14 DIAGNOSIS — R55 Syncope and collapse: Secondary | ICD-10-CM | POA: Diagnosis not present

## 2015-11-17 ENCOUNTER — Telehealth: Payer: Self-pay | Admitting: Neurology

## 2015-11-17 NOTE — Telephone Encounter (Signed)
  I called the patient. The MRI of the brain was OK, single minor white matter lesion in the left frontal area, of no clinical significance. Carotid doppler is pending, ? Order EEG  MRI brain 11/16/15:  IMPRESSION:  Equivocal MRI brain (without) demonstrating: 1. Single left frontal juxtacortical focus of non-specific gliosis (2mm).  2. No acute findings.

## 2015-11-21 ENCOUNTER — Ambulatory Visit: Payer: Self-pay | Admitting: Cardiology

## 2015-11-21 ENCOUNTER — Ambulatory Visit (INDEPENDENT_AMBULATORY_CARE_PROVIDER_SITE_OTHER): Payer: BLUE CROSS/BLUE SHIELD | Admitting: Cardiology

## 2015-11-21 ENCOUNTER — Encounter: Payer: Self-pay | Admitting: Cardiology

## 2015-11-21 VITALS — BP 136/85 | HR 74 | Ht 67.0 in | Wt 228.0 lb

## 2015-11-21 DIAGNOSIS — R42 Dizziness and giddiness: Secondary | ICD-10-CM

## 2015-11-21 NOTE — Patient Instructions (Signed)
Medication Instructions:  The current medical regimen is effective;  continue present plan and medications.  Follow-Up: Follow up as needed with Dr Hochrein.  Thank you for choosing [facility]!!     

## 2015-11-21 NOTE — Progress Notes (Signed)
Cardiology Office Note   Date:  11/21/2015   ID:  William Mack, DOB 05-Dec-1980, MRN [id number]  PCP:  Rudi Heap, MD  Cardiologist:   Rollene Rotunda, MD   No chief complaint on file.     History of Present Illness: William Mack is a 35 y.o. male who presents for evaluation of dizziness. He has had no prior cardiac history. There was an episode of syncope in November after he cut his hand. He describes dizziness that happens sporadically. It feels like it feels like a floating episode in his head.  He does not have palpitations. He doesn't describe visual disturbances or actual dizziness. He can't bring this on. It doesn't seem to be positional. He does have some mild orthostasis but this does not correlate with his other symptoms. He does not have any shortness of breath, PND or orthopnea. He does not have any chest pressure, neck or arm discomfort. He doesn't have weight gain or edema. He does stay active although he doesn't exercise routinely. He has had a neurologic evaluation and is going to have an MRI. Of note he did wear an event monitor and I reviewed this. There were no arrhythmias noted.     Past Medical History  Diagnosis Date  . Kidney stones 06/01/2015  . Hypertension   . GERD (gastroesophageal reflux disease)   . Dizziness and giddiness 11/01/2015    Past Surgical History  Procedure Laterality Date  . None       Current Outpatient Prescriptions  Medication Sig Dispense Refill  . fluticasone (FLONASE) 50 MCG/ACT nasal spray Place 2 sprays into both nostrils daily. 16 g 6  . lisinopril-hydrochlorothiazide (PRINZIDE,ZESTORETIC) 10-12.5 MG tablet Take 1 tablet by mouth daily. 30 tablet 5  . meclizine (ANTIVERT) 25 MG tablet TAKE ONE TABLET BY MOUTH THREE TIMES DAILY AS NEEDED FOR DIZZINESS 30 tablet 1  . omeprazole (PRILOSEC) 20 MG capsule Take 1 capsule (20 mg total) by mouth daily. 30 capsule 7   No current facility-administered medications for this  visit.    Allergies:   Review of patient's allergies indicates no known allergies.    Social History:  The patient  reports that he has never smoked. He has never used smokeless tobacco. He reports that he does not drink alcohol or use illicit drugs.   Family History:  The patient's family history includes Heart attack (age of onset: 13) in his father; Hypertension in his brother, father, and mother.    ROS:  Please see the history of present illness.   Otherwise, review of systems are positive for none.   All other systems are reviewed and negative.    PHYSICAL EXAM: VS:  BP 136/85 mmHg  Pulse 74  Ht  (1.702 m)  Wt 228 lb (103.42 kg)  BMI 35.70 kg/m2  SpO2 99% , BMI Body mass index is 35.7 kg/(m^2). GENERAL:  Well appearing HEENT:  Pupils equal round and reactive, fundi not visualized, oral mucosa unremarkable NECK:  No jugular venous distention, waveform within normal limits, carotid upstroke brisk and symmetric, no bruits, no thyromegaly LYMPHATICS:  No cervical, inguinal adenopathy LUNGS:  Clear to auscultation bilaterally BACK:  No CVA tenderness CHEST:  Unremarkable HEART:  PMI not displaced or sustained,S1 and S2 within normal limits, no S3, no S4, no clicks, no rubs, no murmurs ABD:  Flat, positive bowel sounds normal in frequency in pitch, no bruits, no rebound, no guarding, no midline pulsatile mass, no hepatomegaly, no splenomegaly  EXT:  2 plus pulses throughout, no edema, no cyanosis no clubbing SKIN:  No rashes no nodules NEURO:  Cranial nerves II through XII grossly intact, motor grossly intact throughout PSYCH:  Cognitively intact, oriented to person place and time    EKG:  EKG is not ordered today. The ekg ordered 08/06/15 demonstrates normal sinus rhythm, rate 62, axis within normal limits, intervals within normal limits, no acute ST-T wave changes.   Recent Labs: 07/06/2015: ALT 25 09/27/2015: BUN 19; Creatinine, Ser 1.04; Platelets 338; Potassium 3.9;  Sodium 140; TSH 2.810    Lipid Panel No results found for: CHOL, TRIG, HDL, CHOLHDL, VLDL, LDLCALC, LDLDIRECT    Wt Readings from Last 3 Encounters:  11/21/15 228 lb (103.42 kg)  11/13/15 230 lb (104.327 kg)  11/01/15 230 lb (104.327 kg)      Other studies Reviewed: Additional studies/ records that were reviewed today include:  Neurology office note and event monitor. Review of the above records demonstrates:  Please see elsewhere in the note.     ASSESSMENT AND PLAN:  DIZZINESS:    At this point I don't see a cardiac etiology. He has no high-risk physical findings or history suggestive of obstructive disease. No further cardiovascular testing is suggested.  SYNCOPE:  I think this was a vagal episode. No change in therapy is indicated.    Current medicines are reviewed at length with the patient today.  The patient does not have concerns regarding medicines.  The following changes have been made:  no change  Labs/ tests ordered today include:  Nonr No orders of the defined types were placed in this encounter.     Disposition:   FU with me as needed.      Signed, Rollene Rotunda, MD  11/21/2015 3:41 PM    [facility]

## 2015-11-22 ENCOUNTER — Encounter: Payer: Self-pay | Admitting: Cardiology

## 2015-12-19 ENCOUNTER — Ambulatory Visit: Payer: Self-pay | Admitting: Cardiology

## 2015-12-26 ENCOUNTER — Encounter (HOSPITAL_BASED_OUTPATIENT_CLINIC_OR_DEPARTMENT_OTHER): Payer: Self-pay

## 2015-12-26 ENCOUNTER — Emergency Department (HOSPITAL_BASED_OUTPATIENT_CLINIC_OR_DEPARTMENT_OTHER)
Admission: EM | Admit: 2015-12-26 | Discharge: 2015-12-26 | Disposition: A | Discharge status: Home / Self Care | Payer: BLUE CROSS/BLUE SHIELD | Attending: Emergency Medicine | Admitting: Emergency Medicine

## 2015-12-26 DIAGNOSIS — Z79899 Other long term (current) drug therapy: Secondary | ICD-10-CM | POA: Diagnosis not present

## 2015-12-26 DIAGNOSIS — I10 Essential (primary) hypertension: Secondary | ICD-10-CM | POA: Diagnosis not present

## 2015-12-26 DIAGNOSIS — R251 Tremor, unspecified: Secondary | ICD-10-CM | POA: Diagnosis not present

## 2015-12-26 DIAGNOSIS — R3915 Urgency of urination: Secondary | ICD-10-CM | POA: Insufficient documentation

## 2015-12-26 DIAGNOSIS — R55 Syncope and collapse: Secondary | ICD-10-CM

## 2015-12-26 DIAGNOSIS — Z7951 Long term (current) use of inhaled steroids: Secondary | ICD-10-CM | POA: Diagnosis not present

## 2015-12-26 DIAGNOSIS — Z87442 Personal history of urinary calculi: Secondary | ICD-10-CM | POA: Diagnosis not present

## 2015-12-26 DIAGNOSIS — R61 Generalized hyperhidrosis: Secondary | ICD-10-CM | POA: Insufficient documentation

## 2015-12-26 DIAGNOSIS — R42 Dizziness and giddiness: Secondary | ICD-10-CM | POA: Diagnosis present

## 2015-12-26 DIAGNOSIS — K219 Gastro-esophageal reflux disease without esophagitis: Secondary | ICD-10-CM | POA: Diagnosis not present

## 2015-12-26 NOTE — ED Notes (Signed)
MD at bedside. 

## 2015-12-26 NOTE — ED Notes (Addendum)
Per pt dizziness started approx 630pm-felt like he was going to pass out-wife states pt was sweating, turned gray -denies pain-states hx of same x 3 months with neuro f/u-NAD-steady gait

## 2015-12-26 NOTE — ED Provider Notes (Signed)
CSN: [id number]     Arrival date & time 12/26/15  2030 History  By signing my name below, I, Marisue Humble, attest that this documentation has been prepared under the direction and in the presence of Rolland Porter, MD . Electronically Signed: Marisue Humble, Scribe. 12/26/2015. 9:14 PM.   Chief Complaint  Patient presents with  . Dizziness   The history is provided by the patient and the spouse. No language interpreter was used.   HPI Comments:  William Mack is a 35 y.o. male with PMHx of HTN, GERD, kidney stones, and dizziness who presents to the Emergency Department complaining of dizziness, tingling in head and feeling like he was going to pass out onset ~1830 today. Pt reports associated sudden onset diaphoresis, grey lips and hands, tremors, and urge to have bowel movement lasting ~10 minutes. Symptoms are currently alleviated. Pt states he was sitting down eating dinner at a restaurant when he started "not feeling right"; he did not stand up after symptom onset because he knew he would pass out. The fire chief was present in the restaurant and evaluated the pt at the scene. He reports normal PO intake today. Pt has a h/o similar symptoms; he passed out ~4 months ago after cutting his thumb. He reports this episode felt similar to the previous episode. Pt denies chest pain, shortness of breath, or "room spinning" dizziness.  Past Medical History  Diagnosis Date  . Kidney stones 06/01/2015  . Hypertension   . GERD (gastroesophageal reflux disease)   . Dizziness and giddiness 11/01/2015   Past Surgical History  Procedure Laterality Date  . None     Family History  Problem Relation Age of Onset  . Hypertension Mother   . Heart attack Father 63  . Hypertension Father   . Hypertension Brother    Social History  Substance Use Topics  . Smoking status: Never Smoker   . Smokeless tobacco: Never Used  . Alcohol Use: No    Review of Systems  Constitutional: Positive for  diaphoresis. Negative for fever, chills, appetite change and fatigue.  HENT: Negative for mouth sores, sore throat and trouble swallowing.   Eyes: Negative for visual disturbance.  Respiratory: Negative for cough, chest tightness, shortness of breath and wheezing.   Cardiovascular: Negative for chest pain.  Gastrointestinal: Negative for nausea, vomiting, abdominal pain, diarrhea and abdominal distention.       Bowel urgency  Endocrine: Negative for polydipsia, polyphagia and polyuria.  Genitourinary: Negative for dysuria, frequency and hematuria.  Musculoskeletal: Negative for gait problem.  Skin: Positive for color change (grey lips and hands). Negative for pallor and rash.  Neurological: Positive for dizziness and tremors. Negative for syncope, light-headedness and headaches.  Hematological: Does not bruise/bleed easily.  Psychiatric/Behavioral: Negative for behavioral problems and confusion.   Allergies  Review of patient's allergies indicates no known allergies.  Home Medications   Prior to Admission medications   Medication Sig Start Date End Date Taking? Authorizing Provider  fluticasone (FLONASE) 50 MCG/ACT nasal spray Place 2 sprays into both nostrils daily. 10/30/15   Ernestina Penna, MD  lisinopril-hydrochlorothiazide (PRINZIDE,ZESTORETIC) 10-12.5 MG tablet Take 1 tablet by mouth daily. 10/30/15   Ernestina Penna, MD  meclizine (ANTIVERT) 25 MG tablet TAKE ONE TABLET BY MOUTH THREE TIMES DAILY AS NEEDED FOR DIZZINESS 10/30/15   Ernestina Penna, MD  omeprazole (PRILOSEC) 20 MG capsule Take 1 capsule (20 mg total) by mouth daily. 02/09/15   Tiffany Daphene Jaeger, PA-C  BP 147/88 mmHg  Pulse 62  Temp(Src) 97.6 F (36.4 C) (Oral)  Resp 16  Ht 5\' 7"  (1.702 m)  Wt 220 lb (99.791 kg)  BMI 34.45 kg/m2  SpO2 100% Physical Exam  Constitutional: He is oriented to person, place, and time. He appears well-developed and well-nourished. No distress.  HENT:  Head: Normocephalic and atraumatic.   Right Ear: Hearing normal.  Left Ear: Hearing normal.  Nose: Nose normal.  Mouth/Throat: Oropharynx is clear and moist and mucous membranes are normal.  Eyes: Conjunctivae and EOM are normal. Pupils are equal, round, and reactive to light.  Neck: Normal range of motion. Neck supple.  Cardiovascular: Regular rhythm, S1 normal and S2 normal.  Exam reveals no gallop and no friction rub.   No murmur heard. Marked respiratory variation to deep breathing  Pulmonary/Chest: Effort normal and breath sounds normal. No respiratory distress. He exhibits no tenderness.  Abdominal: Soft. Normal appearance and bowel sounds are normal. There is no hepatosplenomegaly. There is no tenderness. There is no rebound, no guarding, no tenderness at McBurney's point and negative Murphy's sign. No hernia.  Musculoskeletal: Normal range of motion.  Neurological: He is alert and oriented to person, place, and time. He has normal strength. No cranial nerve deficit or sensory deficit. Coordination normal. GCS eye subscore is 4. GCS verbal subscore is 5. GCS motor subscore is 6.  Skin: Skin is warm, dry and intact. No rash noted. No cyanosis.  Psychiatric: He has a normal mood and affect. His speech is normal and behavior is normal. Thought content normal.  Nursing note and vitals reviewed.  ED Course  Procedures  DIAGNOSTIC STUDIES:  Oxygen Saturation is 100% on RA, normal by my interpretation.    COORDINATION OF CARE:  9:09 PM Discussed vagal episodes with pt. Informed pt no imaging is necessary at this time due to recent imaging and evaluations. Discussed treatment plan with pt at bedside and pt agreed to plan.  Labs Review Labs Reviewed - No data to display  Imaging Review No results found. I have personally reviewed and evaluated these images and lab results as part of my medical decision-making.   EKG Interpretation None      MDM   Final diagnoses:  Vaso vagal episode    Patient did not eat  much of anything all day long and had salad 3 glasses of tea. He had an episode where he became pale diaphoretic of some associated abdominal cramping and urge for bowel movement. Symptoms consistent with classic vasovagal episode. He is undergone neurological, cardiac evaluation including normal MRI, EEG, and Holter/event monitor. Has similar episode several months ago when he cut his thumb saw blood thought she had had a bowel movement and passed out. He has arrest race variation to his heart rate here. All consistent with sinus arrhythmia and likely vasovagal episode   I personally performed the services described in this documentation, which was scribed in my presence. The recorded information has been reviewed and is accurate.    Rolland PorterMark Litzi Binning, MD 12/26/15 2124

## 2015-12-26 NOTE — ED Notes (Signed)
Pt states he was sitting down eating earlier tonight and got dizzy. Wife reports he was gray and clammy at that time. No LOC. No c/o pain. Has had similar episodes and saw PCP-referred to neuro-checked out ok and wore holter monitor for 30 days with several episodes of "irregular beats".

## 2015-12-26 NOTE — Discharge Instructions (Signed)
Near-Syncope °Near-syncope (commonly known as near fainting) is sudden weakness, dizziness, or feeling like you might pass out. During an episode of near-syncope, you may also develop pale skin, have tunnel vision, or feel sick to your stomach (nauseous). Near-syncope may occur when getting up after sitting or while standing for a long time. It is caused by a sudden decrease in blood flow to the brain. This decrease can result from various causes or triggers, most of which are not serious. However, because near-syncope can sometimes be a sign of something serious, a medical evaluation is required. The specific cause is often not determined. °HOME CARE INSTRUCTIONS  °Monitor your condition for any changes. The following actions may help to alleviate any discomfort you are experiencing: °· Have someone stay with you until you feel stable. °· Lie down right away and prop your feet up if you start feeling like you might faint. Breathe deeply and steadily. Wait until all the symptoms have passed. Most of these episodes last only a few minutes. You may feel tired for several hours.   °· Drink enough fluids to keep your urine clear or pale yellow.   °· If you are taking blood pressure or heart medicine, get up slowly when seated or lying down. Take several minutes to sit and then stand. This can reduce dizziness. °· Follow up with your health care provider as directed.  °SEEK IMMEDIATE MEDICAL CARE IF:  °· You have a severe headache.   °· You have unusual pain in the chest, abdomen, or back.   °· You are bleeding from the mouth or rectum, or you have black or tarry stool.   °· You have an irregular or very fast heartbeat.   °· You have repeated fainting or have seizure-like jerking during an episode.   °· You faint when sitting or lying down.   °· You have confusion.   °· You have difficulty walking.   °· You have severe weakness.   °· You have vision problems.   °MAKE SURE YOU:  °· Understand these instructions. °· Will  watch your condition. °· Will get help right away if you are not doing well or get worse. °  °This information is not intended to replace advice given to you by your health care provider. Make sure you discuss any questions you have with your health care provider. °  °Document Released: 09/29/2005 Document Revised: 10/04/2013 Document Reviewed: 03/04/2013 °Elsevier Interactive Patient Education ©2016 [employer] ° ° °Syncope, commonly known as fainting, is a temporary loss of consciousness. It occurs when the blood flow to the brain is reduced.  ° °Vasovagal syncope (also called neurocardiogenic syncope) is a fainting spell in which the blood flow to the brain is reduced because of a sudden drop in heart rate and blood pressure. Vasovagal syncope occurs when the brain and the cardiovascular system (blood vessels) do not adequately communicate and respond to each other. This is the most common cause of fainting. It often occurs in response to fear or some other type of emotional or physical stress. The body has a reaction in which the heart starts beating too slowly or the blood vessels expand, reducing blood pressure. This type of fainting spell is generally considered harmless. However, injuries can occur if a person takes a sudden fall during a fainting spell.  °CAUSES  °Vasovagal syncope occurs when a person's blood pressure and heart rate decrease suddenly, usually in response to a trigger. Many things and situations can trigger an episode. Some of these include:  °· Pain.   °· Fear.   °·   The sight of blood or medical procedures, such as blood being drawn from a vein.   °· Common activities, such as coughing, swallowing, stretching, or going to the bathroom.   °· Emotional stress.   °· Prolonged standing, especially in a warm environment.   °· Lack of sleep or rest.   °· Prolonged lack of food.   °· Prolonged lack of fluids.   °· Recent illness. °· The use of certain drugs that affect blood pressure, such as  cocaine, alcohol, marijuana, inhalants, and opiates.   °SYMPTOMS  °Before the fainting episode, you may:  °· Feel dizzy or light headed.   °· Become pale. °· Sense that you are going to faint.   °· Feel like the room is spinning.   °· Have tunnel vision, only seeing directly in front of you.   °· Feel sick to your stomach (nauseous).   °· See spots or slowly lose vision.   °· Hear ringing in your ears.   °· Have a headache.   °· Feel warm and sweaty.   °· Feel a sensation of pins and needles. °During the fainting spell, you will generally be unconscious for no longer than a couple minutes before waking up and returning to normal. If you get up too quickly before your body can recover, you may faint again. Some twitching or jerky movements may occur during the fainting spell.  °DIAGNOSIS  °Your health care provider will ask about your symptoms, take a medical history, and perform a physical exam. Various tests may be done to rule out other causes of fainting. These may include blood tests and tests to check the heart, such as electrocardiography, echocardiography, and possibly an electrophysiology study. When other causes have been ruled out, a test may be done to check the body's response to changes in position (tilt table test). °TREATMENT  °Most cases of vasovagal syncope do not require treatment. Your health care provider may recommend ways to avoid fainting triggers and may provide home strategies for preventing fainting. If you must be exposed to a possible trigger, you can drink additional fluids to help reduce your chances of having an episode of vasovagal syncope. If you have warning signs of an oncoming episode, you can respond by positioning yourself favorably (lying down). °If your fainting spells continue, you may be given medicines to prevent fainting. Some medicines may help make you more resistant to repeated episodes of vasovagal syncope. Special exercises or compression stockings may be recommended.  In rare cases, the surgical placement of a pacemaker is considered. °HOME CARE INSTRUCTIONS  °· Learn to identify the warning signs of vasovagal syncope.   °· Sit or lie down at the first warning sign of a fainting spell. If sitting, put your head down between your legs. If you lie down, swing your legs up in the air to increase blood flow to the brain.   °· Avoid hot tubs and saunas. °· Avoid prolonged standing. °· Drink enough fluids to keep your urine clear or pale yellow. Avoid caffeine. °· Increase salt in your diet as directed by your health care provider.   °· If you have to stand for a long time, perform movements such as:   °¨ Crossing your legs.   °¨ Flexing and stretching your leg muscles.   °¨ Squatting.   °¨ Moving your legs.   °¨ Bending over.   °· Only take over-the-counter or prescription medicines as directed by your health care provider. Do not suddenly stop any medicines without asking your health care provider first.  °SEEK MEDICAL CARE IF:  °· Your fainting spells continue or happen more frequently in spite   of treatment.   °· You lose consciousness for more than a couple minutes. °· You have fainting spells during or after exercising or after being startled.   °· You have new symptoms that occur with the fainting spells, such as:   °¨ Shortness of breath. °¨ Chest pain.   °¨ Irregular heartbeat.   °· You have episodes of twitching or jerky movements that last longer than a few seconds. °· You have episodes of twitching or jerky movements without obvious fainting. °SEEK IMMEDIATE MEDICAL CARE IF:  °· You have injuries or bleeding after a fainting spell.   °· You have episodes of twitching or jerky movements that last longer than 5 minutes.   °· You have more than one spell of twitching or jerky movements before returning to consciousness after fainting. °  °This information is not intended to replace advice given to you by your health care provider. Make sure you discuss any questions you have  with your health care provider. °  °Document Released: 09/15/2012 Document Revised: 02/13/2015 Document Reviewed: 09/15/2012 °Elsevier Interactive Patient Education ©2016 Elsevier Inc. ° ° °

## 2016-01-20 ENCOUNTER — Other Ambulatory Visit: Payer: Self-pay | Admitting: Physician Assistant

## 2016-01-21 ENCOUNTER — Other Ambulatory Visit: Payer: Self-pay | Admitting: Physician Assistant

## 2016-02-06 ENCOUNTER — Encounter: Payer: Self-pay | Admitting: *Deleted

## 2016-02-06 ENCOUNTER — Encounter (INDEPENDENT_AMBULATORY_CARE_PROVIDER_SITE_OTHER): Payer: Self-pay

## 2016-02-06 ENCOUNTER — Encounter: Payer: Self-pay | Admitting: Family Medicine

## 2016-02-06 ENCOUNTER — Ambulatory Visit (INDEPENDENT_AMBULATORY_CARE_PROVIDER_SITE_OTHER): Payer: BLUE CROSS/BLUE SHIELD | Admitting: Family Medicine

## 2016-02-06 VITALS — BP 128/83 | HR 81 | Temp 98.2°F | Ht 67.0 in | Wt 219.0 lb

## 2016-02-06 DIAGNOSIS — K219 Gastro-esophageal reflux disease without esophagitis: Secondary | ICD-10-CM | POA: Diagnosis not present

## 2016-02-06 DIAGNOSIS — I10 Essential (primary) hypertension: Secondary | ICD-10-CM

## 2016-02-06 NOTE — Progress Notes (Signed)
BP 128/83 mmHg  Pulse 81  Temp(Src) 98.2 F (36.8 C) (Oral)  Ht '5\' 7"'  (1.702 m)  Wt 219 lb (99.338 kg)  BMI 34.29 kg/m2   Subjective:    Patient ID: William Mack, male    DOB: 07/05/1981, 35 y.o.   MRN: [id number]  HPI: William Mack is a 35 y.o. male presenting on 02/06/2016 for Hypertension and Labwork   HPI Hypertension Patient is coming in today for a six-month hypertension recheck. His blood pressure today is 128/83. He is currently on lisinopril-hydrochlorothiazide and has been on it for at least over a year. Patient denies headaches, blurred vision, chest pains, shortness of breath, or weakness. Denies any side effects from medication and is content with current medication.   Follow-up GERD Patient says the omeprazole has been doing very well to control his heartburn and acid. He does notice when he does not take it and so he has to take it consistently. He denies any abdominal pains or blood in his stool. He has not had any nausea or vomiting either.  Relevant past medical, surgical, family and social history reviewed and updated as indicated. Interim medical history since our last visit reviewed. Allergies and medications reviewed and updated.  Review of Systems  Constitutional: Negative for fever and appetite change.  HENT: Negative for congestion, ear discharge and ear pain.   Eyes: Negative for discharge and visual disturbance.  Respiratory: Negative for cough, shortness of breath and wheezing.   Cardiovascular: Negative for chest pain and leg swelling.  Gastrointestinal: Negative for abdominal pain, diarrhea and constipation.  Genitourinary: Negative for difficulty urinating.  Musculoskeletal: Negative for back pain and gait problem.  Skin: Negative for rash.  Neurological: Negative for dizziness, syncope, light-headedness and headaches.  All other systems reviewed and are negative.   Per HPI unless specifically indicated above     Medication List         This list is accurate as of: 02/06/16  4:53 PM.  Always use your most recent med list.               fluticasone 50 MCG/ACT nasal spray  Commonly known as:  FLONASE  Place 2 sprays into both nostrils daily.     lisinopril-hydrochlorothiazide 10-12.5 MG tablet  Commonly known as:  PRINZIDE,ZESTORETIC  Take 1 tablet by mouth daily.     meclizine 25 MG tablet  Commonly known as:  ANTIVERT  TAKE ONE TABLET BY MOUTH THREE TIMES DAILY AS NEEDED FOR DIZZINESS     omeprazole 20 MG capsule  Commonly known as:  PRILOSEC  Take 1 capsule (20 mg total) by mouth daily.           Objective:    BP 128/83 mmHg  Pulse 81  Temp(Src) 98.2 F (36.8 C) (Oral)  Ht '5\' 7"'  (1.702 m)  Wt 219 lb (99.338 kg)  BMI 34.29 kg/m2  Wt Readings from Last 3 Encounters:  02/06/16 219 lb (99.338 kg)  12/26/15 220 lb (99.791 kg)  11/21/15 228 lb (103.42 kg)    Physical Exam  Constitutional: He is oriented to person, place, and time. He appears well-developed and well-nourished. No distress.  Eyes: Conjunctivae and EOM are normal. Pupils are equal, round, and reactive to light. Right eye exhibits no discharge. No scleral icterus.  Neck: Neck supple. No thyromegaly present.  Cardiovascular: Normal rate, regular rhythm, normal heart sounds and intact distal pulses.   No murmur heard. Pulmonary/Chest: Effort normal and breath sounds normal.  No respiratory distress. He has no wheezes.  Abdominal: Soft. Bowel sounds are normal. He exhibits no distension. There is no tenderness. There is no rebound.  Musculoskeletal: Normal range of motion. He exhibits no edema.  Lymphadenopathy:    He has no cervical adenopathy.  Neurological: He is alert and oriented to person, place, and time. Coordination normal.  Skin: Skin is warm and dry. No rash noted. He is not diaphoretic.  Psychiatric: He has a normal mood and affect. His behavior is normal.  Vitals reviewed.   Results for orders placed or performed in  visit on 09/27/15  [facility]  Result Value Ref Range   Glucose 70 65 - 99 mg/dL   BUN 19 6 - 20 mg/dL   Creatinine, Ser 1.04 0.76 - 1.27 mg/dL   GFR calc non Af Amer 93 >59 mL/min/1.73   GFR calc Af Amer 107 >59 mL/min/1.73   BUN/Creatinine Ratio 18 8 - 19   Sodium 140 134 - 144 mmol/L   Potassium 3.9 3.5 - 5.2 mmol/L   Chloride 97 96 - 106 mmol/L   CO2 26 18 - 29 mmol/L   Calcium 9.6 8.7 - 10.2 mg/dL  Thyroid Panel With TSH  Result Value Ref Range   TSH 2.810 0.450 - 4.500 uIU/mL   T4, Total 8.9 4.5 - 12.0 ug/dL   T3 Uptake Ratio 24 24 - 39 %   Free Thyroxine Index 2.1 1.2 - 4.9  CBC with Differential/Platelet  Result Value Ref Range   WBC 9.9 3.4 - 10.8 x10E3/uL   RBC 4.52 4.14 - 5.80 x10E6/uL   Hemoglobin 14.2 12.6 - 17.7 g/dL   Hematocrit 42.4 37.5 - 51.0 %   MCV 94 79 - 97 fL   MCH 31.4 26.6 - 33.0 pg   MCHC 33.5 31.5 - 35.7 g/dL   RDW 13.6 12.3 - 15.4 %   Platelets 338 150 - 379 x10E3/uL   Neutrophils 56 %   Lymphs 37 %   Monocytes 5 %   Eos 1 %   Basos 1 %   Neutrophils Absolute 5.5 1.4 - 7.0 x10E3/uL   Lymphocytes Absolute 3.7 (H) 0.7 - 3.1 x10E3/uL   Monocytes Absolute 0.5 0.1 - 0.9 x10E3/uL   EOS (ABSOLUTE) 0.1 0.0 - 0.4 x10E3/uL   Basophils Absolute 0.1 0.0 - 0.2 x10E3/uL   Immature Granulocytes 0 %   Immature Grans (Abs) 0.0 0.0 - 0.1 x10E3/uL      Assessment & Plan:   Problem List Items Addressed This Visit      Cardiovascular and Mediastinum   Essential hypertension - Primary   Relevant Orders   Lipid panel (Completed)   CMP14+EGFR (Completed)     Digestive   GERD (gastroesophageal reflux disease)       Follow up plan: Return in about 6 months (around 08/07/2016), or if symptoms worsen or fail to improve, for Hypertension recheck.  Counseling provided for all of the vaccine components Orders Placed This Encounter  Procedures  . Lipid panel  . [place], MD [facility] 02/06/2016, 4:53  PM     .Elizabeth Palau

## 2016-02-07 ENCOUNTER — Telehealth: Payer: Self-pay | Admitting: Family Medicine

## 2016-02-07 LAB — CMP14+EGFR
ALBUMIN: 4.7 g/dL (ref 3.5–5.5)
ALK PHOS: 37 IU/L — AB (ref 39–117)
ALT: 21 IU/L (ref 0–44)
AST: 19 IU/L (ref 0–40)
Albumin/Globulin Ratio: 1.9 (ref 1.2–2.2)
BUN / CREAT RATIO: 15 (ref 9–20)
BUN: 17 mg/dL (ref 6–20)
Bilirubin Total: 0.5 mg/dL (ref 0.0–1.2)
CO2: 21 mmol/L (ref 18–29)
CREATININE: 1.13 mg/dL (ref 0.76–1.27)
Calcium: 9.6 mg/dL (ref 8.7–10.2)
Chloride: 101 mmol/L (ref 96–106)
GFR, EST AFRICAN AMERICAN: 96 mL/min/{1.73_m2} (ref >59)
GFR, EST NON AFRICAN AMERICAN: 83 mL/min/{1.73_m2} (ref >59)
GLOBULIN, TOTAL: 2.5 g/dL (ref 1.5–4.5)
Glucose: 79 mg/dL (ref 65–99)
Potassium: 4.2 mmol/L (ref 3.5–5.2)
SODIUM: 143 mmol/L (ref 134–144)
Total Protein: 7.2 g/dL (ref 6.0–8.5)

## 2016-02-07 LAB — LIPID PANEL
CHOLESTEROL TOTAL: 218 mg/dL — AB (ref 100–199)
Chol/HDL Ratio: 4.6 ratio units (ref 0.0–5.0)
HDL: 47 mg/dL (ref >39)
LDL Calculated: 153 mg/dL — ABNORMAL HIGH (ref 0–99)
Triglycerides: 88 mg/dL (ref 0–149)
VLDL CHOLESTEROL CAL: 18 mg/dL (ref 5–40)

## 2016-02-08 MED ORDER — ATORVASTATIN CALCIUM 20 MG PO TABS: 20.0000 mg | ORAL_TABLET | Freq: Every day | ORAL | Status: DC

## 2016-02-08 NOTE — Telephone Encounter (Signed)
Prescription sent into pharmacy as requested and left detailed message stating rx was sent to the pharmacy and to [facility] with and further questions or concerns.

## 2016-02-17 ENCOUNTER — Other Ambulatory Visit: Payer: Self-pay | Admitting: Physician Assistant

## 2016-03-05 ENCOUNTER — Other Ambulatory Visit: Payer: Self-pay | Admitting: Family Medicine

## 2016-03-31 ENCOUNTER — Other Ambulatory Visit: Payer: Self-pay | Admitting: Family Medicine

## 2016-04-24 ENCOUNTER — Other Ambulatory Visit: Payer: Self-pay | Admitting: Family Medicine

## 2016-05-03 ENCOUNTER — Other Ambulatory Visit: Payer: Self-pay | Admitting: Family Medicine

## 2016-05-19 ENCOUNTER — Other Ambulatory Visit: Payer: Self-pay | Admitting: Family Medicine

## 2016-05-27 ENCOUNTER — Ambulatory Visit (INDEPENDENT_AMBULATORY_CARE_PROVIDER_SITE_OTHER): Payer: BLUE CROSS/BLUE SHIELD | Admitting: Family Medicine

## 2016-05-27 ENCOUNTER — Encounter: Payer: Self-pay | Admitting: Family Medicine

## 2016-05-27 VITALS — BP 111/72 | HR 73 | Temp 98.9°F | Ht 67.0 in | Wt 215.8 lb

## 2016-05-27 DIAGNOSIS — H81319 Aural vertigo, unspecified ear: Secondary | ICD-10-CM

## 2016-05-27 NOTE — Progress Notes (Signed)
Subjective:  Patient ID: William Mack, male    DOB: 13-Aug-1981  Age: 35 y.o. MRN: [id number]  CC: Dizziness (intermitent, worsening x 2 wks)   HPI William Mack presents for Ongoing evaluation of dizziness. The patient has been evaluated multiple times starting back in December of last year. Of note is that his symptoms seem to be increasing with time. He has been having problems with feeling off balance hitting them randomly and lasting for several minutes sometimes hours. He has within the last 3 weeks had an episode of syncope. This has sometimes been accompanied by heavy sweating and a feeling of intense weakness. These resolved spontaneously after a short time. His notes from neurology, cardiology, [facility], the MRI of the brain have been reviewed as part of today's evaluation. None have shown any evidence for pathology that might be the source for his symptoms. Of note is that the frequency and intensity have been increasing. It started off as once or twice a week several months ago and has increased to 3-4 times a day almost every day. It is interfering with his ability to function and his daily routine. He has not had any chest pain. Palpitations have been minimal. They have not been present for several months. He did discontinue caffeine at Dr. Kathi DerMoore's request several months ago. This seemed to help some initially but long-term has not made a difference. He does not smoke. He denies use of alcohol and other recreational drugs.He does not have any hearing loss associated with the vertigo. His paranasal sinuses were clear when evaluated by MRI earlier this year. History William Mack has a past medical history of Dizziness and giddiness (11/01/2015); GERD (gastroesophageal reflux disease); Hypertension; and Kidney stones (06/01/2015).   He has a past surgical history that includes None.   His family history includes Heart attack (age of onset: 3952) in his father;  Hypertension in his brother, father, and mother.He reports that he has never smoked. He has never used smokeless tobacco. He reports that he does not drink alcohol or use drugs.    ROS Review of Systems  Constitutional: Positive for diaphoresis. Negative for appetite change, chills, fever and unexpected weight change.  HENT: Negative for congestion, ear discharge, ear pain, facial swelling, hearing loss, rhinorrhea and sore throat.   Eyes: Negative for visual disturbance.  Respiratory: Negative for cough and shortness of breath.   Cardiovascular: Negative for chest pain.  Gastrointestinal: Positive for nausea. Negative for abdominal pain, constipation and diarrhea.  Genitourinary: Negative for dysuria and flank pain.  Musculoskeletal: Negative for arthralgias and joint swelling.  Skin: Negative for rash.  Neurological: Positive for dizziness and headaches.  Psychiatric/Behavioral: Negative for dysphoric mood and sleep disturbance.    Objective:  BP 111/72 (BP Location: Left Arm, Patient Position: Sitting, Cuff Size: Large)   Pulse 73   Temp 98.9 F (37.2 C) (Oral)   Ht 5\' 7"  (1.702 m)   Wt 215 lb 12.8 oz (97.9 kg)   SpO2 99%   BMI 33.80 kg/m   BP Readings from Last 3 Encounters:  05/27/16 111/72  02/06/16 128/83  12/26/15 127/73    Wt Readings from Last 3 Encounters:  05/27/16 215 lb 12.8 oz (97.9 kg)  02/06/16 219 lb (99.3 kg)  12/26/15 220 lb (99.8 kg)     Physical Exam  Constitutional: He is oriented to person, place, and time. He appears well-developed and well-nourished. No distress.  HENT:  Head: Normocephalic and atraumatic.  Right Ear: External ear normal.  Left Ear: External ear normal.  Nose: Nose normal.  Mouth/Throat: Oropharynx is clear and moist.  Eyes: Conjunctivae and EOM are normal. Pupils are equal, round, and reactive to light.  Neck: Normal range of motion. Neck supple. No thyromegaly present.  Cardiovascular: Normal rate, regular rhythm and  normal heart sounds.   No murmur heard. Pulmonary/Chest: Effort normal and breath sounds normal. No respiratory distress. He has no wheezes. He has no rales.  Abdominal: Soft. Bowel sounds are normal. He exhibits no distension. There is no tenderness.  Lymphadenopathy:    He has no cervical adenopathy.  Neurological: He is alert and oriented to person, place, and time. He has normal strength and normal reflexes. He displays no tremor and normal reflexes. No cranial nerve deficit or sensory deficit. He exhibits normal muscle tone. He displays a negative Romberg sign. Coordination and gait normal. GCS eye subscore is 4. GCS verbal subscore is 5. GCS motor subscore is 6. He displays no Babinski's sign on the right side. He displays no Babinski's sign on the left side.  Skin: Skin is warm and dry.  Psychiatric: He has a normal mood and affect. His behavior is normal. Judgment and thought content normal.     Lab Results  Component Value Date   WBC 9.9 09/27/2015   HCT 42.4 09/27/2015   PLT 338 09/27/2015   GLUCOSE 79 02/06/2016   CHOL 218 (H) 02/06/2016   TRIG 88 02/06/2016   HDL 47 02/06/2016   LDLCALC 153 (H) 02/06/2016   ALT 21 02/06/2016   AST 19 02/06/2016   NA 143 02/06/2016   K 4.2 02/06/2016   CL 101 02/06/2016   CREATININE 1.13 02/06/2016   BUN 17 02/06/2016   CO2 21 02/06/2016   TSH 2.810 09/27/2015    No results found.  Assessment & Plan:   William Mack was seen today for dizziness.  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 Birdie RiddleKendrick maintain his fluticasone, atorvastatin, omeprazole, lisinopril-hydrochlorothiazide, and meclizine.     Follow-up: 2 days after MRI performed  Mechele ClaudeWarren Jennifer Holland, M.D.

## 2016-06-04 ENCOUNTER — Other Ambulatory Visit: Payer: Self-pay | Admitting: Family Medicine

## 2016-06-11 ENCOUNTER — Ambulatory Visit (HOSPITAL_COMMUNITY)
Admission: RE | Admit: 2016-06-11 | Discharge: 2016-06-11 | Disposition: A | Discharge status: Home / Self Care | Payer: BLUE CROSS/BLUE SHIELD | Source: Ambulatory Visit | Attending: Family Medicine | Admitting: Family Medicine

## 2016-06-11 DIAGNOSIS — R9082 White matter disease, unspecified: Secondary | ICD-10-CM | POA: Diagnosis not present

## 2016-06-11 DIAGNOSIS — H81319 Aural vertigo, unspecified ear: Secondary | ICD-10-CM | POA: Insufficient documentation

## 2016-06-11 LAB — POCT I-STAT CREATININE: Creatinine, Ser: 1.1 mg/dL (ref 0.61–1.24)

## 2016-06-11 MED ORDER — GADOBENATE DIMEGLUMINE 529 MG/ML IV SOLN
20.0000 mL | Freq: Once | INTRAVENOUS | Status: AC | PRN
  Administered 2016-06-11: 20 mL via INTRAVENOUS

## 2016-06-19 ENCOUNTER — Encounter: Payer: Self-pay | Admitting: Family Medicine

## 2016-06-19 ENCOUNTER — Ambulatory Visit (INDEPENDENT_AMBULATORY_CARE_PROVIDER_SITE_OTHER): Payer: BLUE CROSS/BLUE SHIELD | Admitting: Family Medicine

## 2016-06-19 VITALS — BP 134/85 | HR 63 | Temp 97.1°F | Ht 67.0 in | Wt 218.4 lb

## 2016-06-19 DIAGNOSIS — R42 Dizziness and giddiness: Secondary | ICD-10-CM | POA: Diagnosis not present

## 2016-06-19 DIAGNOSIS — R55 Syncope and collapse: Secondary | ICD-10-CM
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
## 2016-06-30 ENCOUNTER — Other Ambulatory Visit: Payer: Self-pay | Admitting: Family Medicine

## 2016-07-31 ENCOUNTER — Other Ambulatory Visit: Payer: Self-pay | Admitting: Family Medicine

## 2016-08-18 ENCOUNTER — Ambulatory Visit: Payer: BLUE CROSS/BLUE SHIELD | Admitting: Neurology

## 2016-08-28 ENCOUNTER — Encounter: Payer: Self-pay | Admitting: Neurology

## 2016-08-28 ENCOUNTER — Ambulatory Visit (INDEPENDENT_AMBULATORY_CARE_PROVIDER_SITE_OTHER): Payer: BLUE CROSS/BLUE SHIELD | Admitting: Neurology

## 2016-08-28 VITALS — BP 134/78 | HR 71 | Ht 67.0 in | Wt 220.0 lb

## 2016-08-28 DIAGNOSIS — R55 Syncope and collapse: Secondary | ICD-10-CM

## 2016-08-28 NOTE — Patient Instructions (Signed)
1. Schedule routine EEG 2. Continue to monitor blood pressure 3. Our office will call you with EEG, if normal, follow-up on an as needed basis, call for any changes 4. As per [place] driving laws, after an episode of loss of consciousness, one should not drive until 6 months event-free
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
## 2016-09-01 ENCOUNTER — Other Ambulatory Visit: Payer: Self-pay | Admitting: Family Medicine

## 2016-09-01 ENCOUNTER — Ambulatory Visit (INDEPENDENT_AMBULATORY_CARE_PROVIDER_SITE_OTHER): Payer: BLUE CROSS/BLUE SHIELD | Admitting: Neurology

## 2016-09-01 DIAGNOSIS — R55 Syncope and collapse: Secondary | ICD-10-CM

## 2016-09-05 ENCOUNTER — Other Ambulatory Visit: Payer: Self-pay | Admitting: Family Medicine

## 2016-09-10 NOTE — Procedures (Signed)
ELECTROENCEPHALOGRAM REPORT  Date of Study: 09/01/2016  Patient's Name: William Mack MRN: [id number] Date of Birth: 12/01/1980  Referring Provider: Dr. Patrcia DollyKaren Cadel Stairs  Clinical History: This is a 35 year old man with recurrent episodes of lightheadedness.  Medications: Lipitor, Prinzide, Zestoretic, Antivert, Prilosec, Actuarylonase  Technical Summary: A multichannel digital EEG recording measured by the international 10-20 system with electrodes applied with paste and impedances below 5000 ohms performed in our laboratory with EKG monitoring in an awake and asleep patient.  Hyperventilation and photic stimulation were performed.  The digital EEG was referentially recorded, reformatted, and digitally filtered in a variety of bipolar and referential montages for optimal display.    Description: The patient is awake and asleep during the recording.  During maximal wakefulness, there is a symmetric, medium voltage 8-9 Hz posterior dominant rhythm that attenuates with eye opening.  The record is symmetric.  During drowsiness and stage I sleep, there is an increase in theta slowing of the background with occasional vertex waves seen.Hyperventilation and photic stimulation did not elicit any abnormalities.  There were no epileptiform discharges or electrographic seizures seen.    EKG lead was unremarkable.  Impression: This awake and asleep EEG is normal.    Clinical Correlation: A normal EEG does not exclude a clinical diagnosis of epilepsy.  If further clinical questions remain, prolonged EEG may be helpful.  Clinical correlation is advised.   Patrcia DollyKaren Wille Aubuchon, M.D.

## 2016-09-11 ENCOUNTER — Telehealth: Payer: Self-pay

## 2016-09-11 NOTE — Telephone Encounter (Signed)
Pls let him know brain wave test is normal, thanks.  Per Dr. Karel JarvisAquino sent patient my chart message of above. Made a couple attempt to call patient with no answer.

## 2016-09-25 ENCOUNTER — Other Ambulatory Visit: Payer: Self-pay | Admitting: Family Medicine

## 2016-10-30 ENCOUNTER — Other Ambulatory Visit: Payer: Self-pay | Admitting: Family Medicine

## 2016-11-16 ENCOUNTER — Other Ambulatory Visit: Payer: Self-pay | Admitting: Family Medicine

## 2016-12-01 ENCOUNTER — Other Ambulatory Visit: Payer: Self-pay | Admitting: Family Medicine

## 2016-12-31 ENCOUNTER — Other Ambulatory Visit: Payer: Self-pay | Admitting: Family Medicine

## 2017-01-06 ENCOUNTER — Other Ambulatory Visit: Payer: Self-pay | Admitting: Family Medicine

## 2017-01-09 ENCOUNTER — Other Ambulatory Visit: Payer: Self-pay | Admitting: Family Medicine

## 2017-01-28 ENCOUNTER — Other Ambulatory Visit: Payer: Self-pay | Admitting: Family Medicine

## 2017-01-31 ENCOUNTER — Other Ambulatory Visit: Payer: Self-pay | Admitting: Family Medicine

## 2017-02-16 ENCOUNTER — Other Ambulatory Visit: Payer: Self-pay | Admitting: Family Medicine

## 2017-03-05 ENCOUNTER — Other Ambulatory Visit: Payer: Self-pay | Admitting: Family Medicine

## 2017-03-06 NOTE — Telephone Encounter (Signed)
Patient is due for a follow-up appointment and labs, give 1 month refill and then have him come back in before he runs out again

## 2017-03-16 ENCOUNTER — Encounter: Payer: Self-pay | Admitting: Nurse Practitioner

## 2017-03-16 ENCOUNTER — Ambulatory Visit (INDEPENDENT_AMBULATORY_CARE_PROVIDER_SITE_OTHER): Payer: BLUE CROSS/BLUE SHIELD | Admitting: Nurse Practitioner

## 2017-03-16 VITALS — BP 136/86 | HR 65 | Temp 97.1°F | Ht 67.0 in | Wt 226.0 lb

## 2017-03-16 DIAGNOSIS — M5441 Lumbago with sciatica, right side: Secondary | ICD-10-CM | POA: Diagnosis not present

## 2017-03-16 MED ORDER — CYCLOBENZAPRINE HCL 5 MG PO TABS: 5.0000 mg | ORAL_TABLET | Freq: Three times a day (TID) | ORAL | 0 refills | Status: DC | PRN

## 2017-03-16 MED ORDER — PREDNISONE 10 MG (21) PO TBPK: ORAL_TABLET | ORAL | 0 refills | Status: DC
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
## 2017-03-16 NOTE — Patient Instructions (Signed)

## 2017-03-24 ENCOUNTER — Other Ambulatory Visit: Payer: Self-pay | Admitting: Family Medicine

## 2017-04-11 ENCOUNTER — Other Ambulatory Visit: Payer: Self-pay | Admitting: Family Medicine

## 2017-06-17 ENCOUNTER — Other Ambulatory Visit: Payer: Self-pay | Admitting: Nurse Practitioner

## 2017-09-14 ENCOUNTER — Other Ambulatory Visit: Payer: Self-pay | Admitting: Nurse Practitioner

## 2017-10-15 ENCOUNTER — Other Ambulatory Visit: Payer: Self-pay | Admitting: Family Medicine

## 2017-11-14 ENCOUNTER — Other Ambulatory Visit: Payer: Self-pay | Admitting: Nurse Practitioner

## 2017-11-27 ENCOUNTER — Other Ambulatory Visit: Payer: Self-pay | Admitting: Family Medicine

## 2017-12-14 ENCOUNTER — Other Ambulatory Visit: Payer: Self-pay | Admitting: Family Medicine

## 2017-12-27 ENCOUNTER — Other Ambulatory Visit: Payer: Self-pay | Admitting: Nurse Practitioner

## 2018-01-28 ENCOUNTER — Other Ambulatory Visit: Payer: Self-pay | Admitting: Family Medicine

## 2018-01-31 ENCOUNTER — Other Ambulatory Visit: Payer: Self-pay | Admitting: Family Medicine

## 2018-02-02 ENCOUNTER — Telehealth: Payer: Self-pay | Admitting: Family Medicine

## 2018-02-02 DIAGNOSIS — M5441 Lumbago with sciatica, right side: Secondary | ICD-10-CM

## 2018-02-02 MED ORDER — OMEPRAZOLE 20 MG PO CPDR: 20.0000 mg | DELAYED_RELEASE_CAPSULE | Freq: Every day | ORAL | 0 refills | Status: DC

## 2018-02-02 MED ORDER — LISINOPRIL-HYDROCHLOROTHIAZIDE 10-12.5 MG PO TABS: 1.0000 | ORAL_TABLET | Freq: Every day | ORAL | 0 refills | Status: DC

## 2018-02-02 MED ORDER — ATORVASTATIN CALCIUM 20 MG PO TABS: 20.0000 mg | ORAL_TABLET | Freq: Every day | ORAL | 5 refills | Status: DC

## 2018-02-02 MED ORDER — CYCLOBENZAPRINE HCL 5 MG PO TABS: 5.0000 mg | ORAL_TABLET | Freq: Three times a day (TID) | ORAL | 0 refills | Status: DC | PRN

## 2018-02-02 NOTE — Telephone Encounter (Signed)
Prescription sent to pharmacy.

## 2018-02-02 NOTE — Telephone Encounter (Signed)
Left message stating that [facility]

## 2018-02-25 ENCOUNTER — Encounter: Payer: Self-pay | Admitting: Family Medicine

## 2018-02-25 ENCOUNTER — Ambulatory Visit: Payer: BLUE CROSS/BLUE SHIELD | Admitting: Family Medicine

## 2018-02-25 VITALS — BP 126/79 | HR 65 | Temp 97.1°F | Ht 67.0 in | Wt 228.5 lb

## 2018-02-25 DIAGNOSIS — I10 Essential (primary) hypertension: Secondary | ICD-10-CM | POA: Diagnosis not present

## 2018-02-25 DIAGNOSIS — K219 Gastro-esophageal reflux disease without esophagitis: Secondary | ICD-10-CM

## 2018-02-25 DIAGNOSIS — H8110 Benign paroxysmal vertigo, unspecified ear: Secondary | ICD-10-CM

## 2018-02-25 DIAGNOSIS — Z87438 Personal history of other diseases of male genital organs: Secondary | ICD-10-CM | POA: Diagnosis not present

## 2018-02-25 DIAGNOSIS — E785 Hyperlipidemia, unspecified: Secondary | ICD-10-CM | POA: Diagnosis not present

## 2018-02-25 MED ORDER — LISINOPRIL-HYDROCHLOROTHIAZIDE 10-12.5 MG PO TABS: 1.0000 | ORAL_TABLET | Freq: Every day | ORAL | 3 refills | Status: DC

## 2018-02-25 MED ORDER — ATORVASTATIN CALCIUM 20 MG PO TABS: 20.0000 mg | ORAL_TABLET | Freq: Every day | ORAL | 3 refills | Status: DC

## 2018-02-25 MED ORDER — OMEPRAZOLE 20 MG PO CPDR
20.0000 mg | DELAYED_RELEASE_CAPSULE | Freq: Every day | ORAL | 3 refills | Status: DC
Start: 2018-02-25 — End: 2019-01-18

## 2018-02-25 NOTE — Assessment & Plan Note (Signed)
BMI greater than 35 with hypertension and cholesterol comorbidities

## 2018-02-25 NOTE — Progress Notes (Signed)
BP 126/79   Pulse 65   Temp (!) 97.1 F (36.2 C) (Oral)   Ht _0  (1.702 m)   Wt 228 lb 8 oz (103.6 kg)   BMI 35.79 kg/m    Subjective:    Patient ID: William Mack, male    DOB: 05/18/1981, 37 y.o.   MRN: [id number]  HPI: William Mack is a 37 y.o. male presenting on 02/25/2018 for Medical Management of Chronic Issues   HPI Hypertension Patient is currently on lisinopril-hydrochlorothiazide, and their blood pressure today is 126/79. Patient denies any lightheadedness or dizziness. Patient denies headaches, blurred vision, chest pains, shortness of breath, or weakness. Denies any side effects from medication and is content with current medication.   GERD Patient is currently on omeprazole.  She denies any major symptoms or abdominal pain or belching or burping. She denies any blood in her stool or lightheadedness or dizziness.   Hyperlipidemia Patient is coming in for recheck of his hyperlipidemia. The patient is currently taking Lipitor. They deny any issues with myalgias or history of liver damage from it. They deny any focal numbness or weakness or chest pain.  Patient is morbidly obese we discussed weight loss strategies and options.  Patient has a history of prostatitis and will check PSA  Relevant past medical, surgical, family and social history reviewed and updated as indicated. Interim medical history since our last visit reviewed. Allergies and medications reviewed and updated.  Review of Systems  Constitutional: Negative for chills and fever.  Respiratory: Negative for shortness of breath and wheezing.   Cardiovascular: Negative for chest pain and leg swelling.  Musculoskeletal: Negative for back pain and gait problem.  Skin: Negative for rash.  Neurological: Negative for dizziness, weakness, light-headedness and numbness.  All other systems reviewed and are negative.   Per HPI unless specifically indicated above   Allergies as of 02/25/2018   No  Known Allergies     Medication List        Accurate as of 02/25/18  3:37 PM. Always use your most recent med list.          atorvastatin 20 MG tablet Commonly known as:  LIPITOR Take 1 tablet (20 mg total) by mouth daily.   cyclobenzaprine 5 MG tablet Commonly known as:  FLEXERIL Take 1 tablet (5 mg total) by mouth 3 (three) times daily as needed for muscle spasms.   fluticasone 50 MCG/ACT nasal spray Commonly known as:  FLONASE Place 2 sprays into both nostrils daily.   lisinopril-hydrochlorothiazide 10-12.5 MG tablet Commonly known as:  PRINZIDE,ZESTORETIC Take 1 tablet by mouth daily.   meclizine 25 MG tablet Commonly known as:  ANTIVERT TAKE 1 TABLET BY MOUTH THREE TIMES DAILY AS NEEDED FOR DIZZINESS   omeprazole 20 MG capsule Commonly known as:  PRILOSEC Take 1 capsule (20 mg total) by mouth daily.          Objective:    BP 126/79   Pulse 65   Temp (!) 97.1 F (36.2 C) (Oral)   Ht _1  (1.702 m)   Wt 228 lb 8 oz (103.6 kg)   BMI 35.79 kg/m   Wt Readings from Last 3 Encounters:  02/25/18 228 lb 8 oz (103.6 kg)  03/16/17 226 lb (102.5 kg)  08/28/16 220 lb (99.8 kg)    Physical Exam  Constitutional: He is oriented to person, place, and time. He appears well-developed and well-nourished. No distress.  Eyes: Conjunctivae are normal. No scleral icterus.  Neck: Neck supple. No thyromegaly present.  Cardiovascular: Normal rate, regular rhythm, normal heart sounds and intact distal pulses.  No murmur heard. Pulmonary/Chest: Effort normal and breath sounds normal. No respiratory distress. He has no wheezes.  Abdominal: He exhibits no distension. There is no tenderness.  Musculoskeletal: Normal range of motion. He exhibits no edema.  Lymphadenopathy:    He has no cervical adenopathy.  Neurological: He is alert and oriented to person, place, and time. Coordination normal.  Skin: Skin is warm and dry. No rash noted. He is not diaphoretic.  Psychiatric: He  has a normal mood and affect. His behavior is normal.  Nursing note and vitals reviewed.       Assessment & Plan:   Problem List Items Addressed This Visit      Cardiovascular and Mediastinum   Essential hypertension - Primary   Relevant Medications   lisinopril-hydrochlorothiazide (PRINZIDE,ZESTORETIC) 10-12.5 MG tablet   atorvastatin (LIPITOR) 20 MG tablet   Other Relevant Orders   CMP14+EGFR (Completed)     Digestive   GERD (gastroesophageal reflux disease)   Relevant Medications   omeprazole (PRILOSEC) 20 MG capsule   Other Relevant Orders   CBC with Differential/Platelet (Completed)     Nervous and Auditory   Benign paroxysmal positional vertigo     Other   Hyperlipidemia LDL goal <130   Relevant Medications   lisinopril-hydrochlorothiazide (PRINZIDE,ZESTORETIC) 10-12.5 MG tablet   atorvastatin (LIPITOR) 20 MG tablet   Other Relevant Orders   Lipid panel (Completed)   Morbid obesity (HCC)    BMI greater than 35 with hypertension and cholesterol comorbidities       Other Visit Diagnoses    History of prostatitis       Relevant Orders   PSA, total and free (Completed)      Continue current medications and will check lab values.  Continue Lipitor and lisinopril-hydrochlorothiazide and omeprazole and meclizine as needed and Flexeril as needed.  Follow up plan: Return in about 1 year (around 02/26/2019), or if symptoms worsen or fail to improve, for Hypertension and cholesterol recheck.  Counseling provided for all of the vaccine components Orders Placed This Encounter  Procedures  . CBC with Differential/Platelet  . CMP14+EGFR  . Lipid panel    Caryl Pina, MD [facility] 02/25/2018, 3:37 PM

## 2018-02-26 LAB — CBC WITH DIFFERENTIAL/PLATELET
Basophils Absolute: 0.1 10*3/uL (ref 0.0–0.2)
Basos: 1 %
EOS (ABSOLUTE): 0.2 10*3/uL (ref 0.0–0.4)
Eos: 3 %
HEMOGLOBIN: 14 g/dL (ref 13.0–17.7)
Hematocrit: 43.1 % (ref 37.5–51.0)
IMMATURE GRANS (ABS): 0 10*3/uL (ref 0.0–0.1)
IMMATURE GRANULOCYTES: 0 %
LYMPHS: 37 %
Lymphocytes Absolute: 3.1 10*3/uL (ref 0.7–3.1)
MCH: 31.4 pg (ref 26.6–33.0)
MCHC: 32.5 g/dL (ref 31.5–35.7)
MCV: 97 fL (ref 79–97)
MONOCYTES: 6 %
Monocytes Absolute: 0.5 10*3/uL (ref 0.1–0.9)
NEUTROS ABS: 4.5 10*3/uL (ref 1.4–7.0)
NEUTROS PCT: 53 %
PLATELETS: 318 10*3/uL (ref 150–379)
RBC: 4.46 x10E6/uL (ref 4.14–5.80)
RDW: 13.6 % (ref 12.3–15.4)
WBC: 8.4 10*3/uL (ref 3.4–10.8)

## 2018-02-26 LAB — LIPID PANEL
CHOL/HDL RATIO: 4.4 ratio (ref 0.0–5.0)
CHOLESTEROL TOTAL: 193 mg/dL (ref 100–199)
HDL: 44 mg/dL (ref >39)
LDL Calculated: 130 mg/dL — ABNORMAL HIGH (ref 0–99)
TRIGLYCERIDES: 95 mg/dL (ref 0–149)
VLDL Cholesterol Cal: 19 mg/dL (ref 5–40)

## 2018-02-26 LAB — CMP14+EGFR
A/G RATIO: 1.8 (ref 1.2–2.2)
ALK PHOS: 37 IU/L — AB (ref 39–117)
ALT: 24 IU/L (ref 0–44)
AST: 23 IU/L (ref 0–40)
Albumin: 4.4 g/dL (ref 3.5–5.5)
BUN/Creatinine Ratio: 18 (ref 9–20)
BUN: 21 mg/dL — ABNORMAL HIGH (ref 6–20)
Bilirubin Total: 0.3 mg/dL (ref 0.0–1.2)
CALCIUM: 9.5 mg/dL (ref 8.7–10.2)
CHLORIDE: 104 mmol/L (ref 96–106)
CO2: 24 mmol/L (ref 20–29)
Creatinine, Ser: 1.16 mg/dL (ref 0.76–1.27)
GFR calc Af Amer: 92 mL/min/{1.73_m2} (ref >59)
GFR, EST NON AFRICAN AMERICAN: 79 mL/min/{1.73_m2} (ref >59)
GLOBULIN, TOTAL: 2.4 g/dL (ref 1.5–4.5)
Glucose: 80 mg/dL (ref 65–99)
POTASSIUM: 3.9 mmol/L (ref 3.5–5.2)
Sodium: 142 mmol/L (ref 134–144)
Total Protein: 6.8 g/dL (ref 6.0–8.5)

## 2018-02-26 LAB — PSA, TOTAL AND FREE
PROSTATE SPECIFIC AG, SERUM: 0.5 ng/mL (ref 0.0–4.0)
PSA FREE PCT: 44 %
PSA FREE: 0.22 ng/mL

## 2018-03-17 ENCOUNTER — Other Ambulatory Visit: Payer: Self-pay | Admitting: Family Medicine

## 2018-06-14 ENCOUNTER — Other Ambulatory Visit: Payer: Self-pay | Admitting: Family Medicine

## 2018-06-16 NOTE — Telephone Encounter (Signed)
Last seen 02/25/18

## 2018-08-08 IMAGING — MR MR BRAIN/IAC WO/W
6 of 16 series · 17 of 48 positions shown · IV contrast (multihance)
Comparison: MRI 11/14/2015

CLINICAL DATA: Vertigo.  Headache and syncope

EXAM:
MR BRAIN/IAC WITHOUT AND WITH CONTRAST
TECHNIQUE: Multiplanar, multisequence MR imaging was performed both before and
after administration of intravenous contrast.
CONTRAST:  20mL MULTIHANCE GADOBENATE DIMEGLUMINE 529 MG/ML IV SOLN

[Series 2: T1 · sagittal · 5.0mm · 0.42mm/px · 1 of 21 slices shown]
[im 1/21]
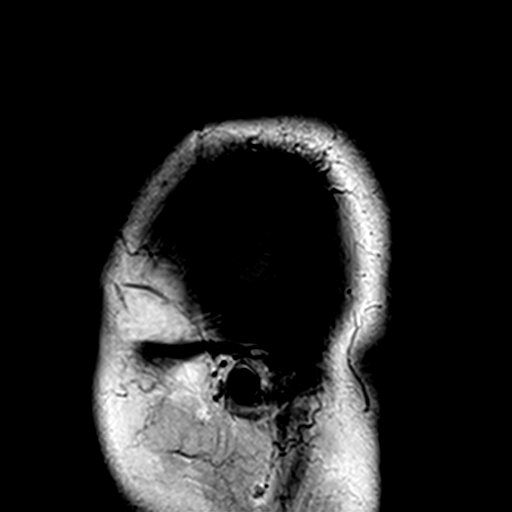

[Series 5: T2 · axial · 5.0mm · 0.43mm/px · 1 of 23 slices shown]
[im 1/23]
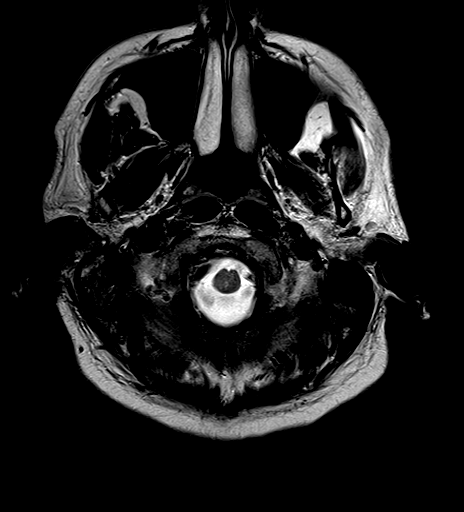

[Series 6: FLAIR · axial · 5.0mm · 0.35mm/px · z∈[-41,+102]mm · 2 of 23 slices shown]
[im 1/23]
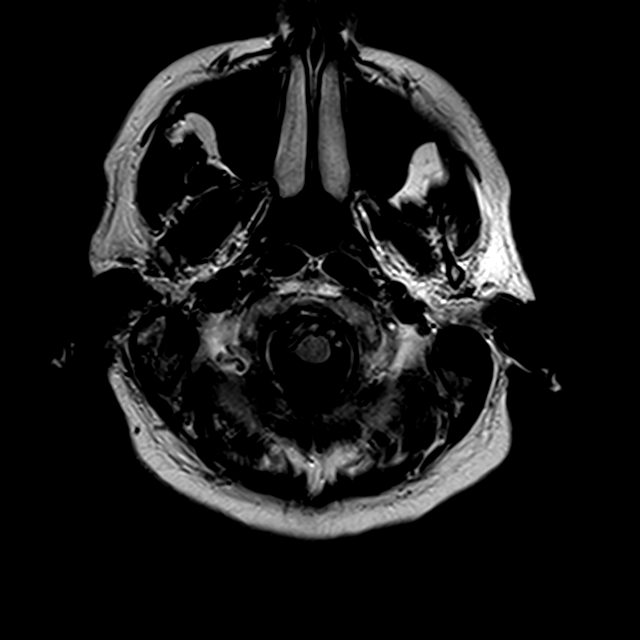
[im 23/23]
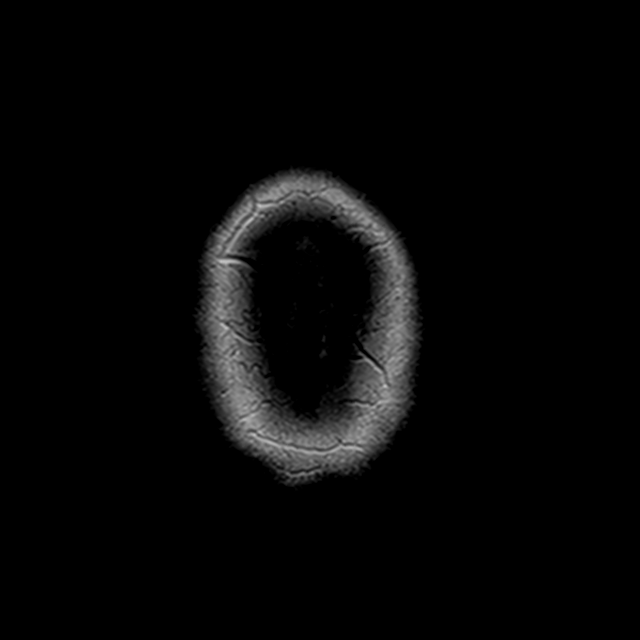

[Series 7: trauma axial · axial · 5.0mm · 0.43mm/px · z∈[-41,+102]mm · 2 of 23 slices shown]
[im 1/23]
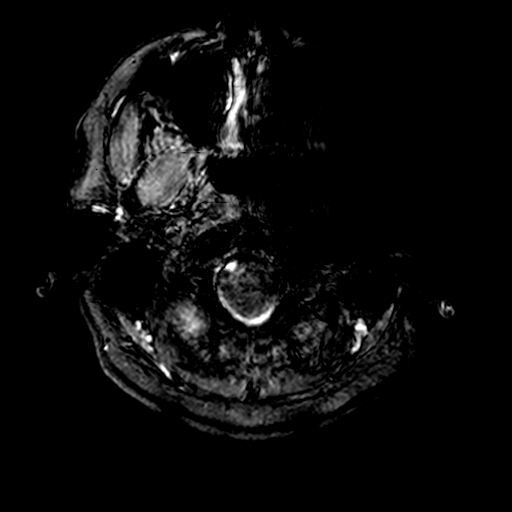
[im 23/23]
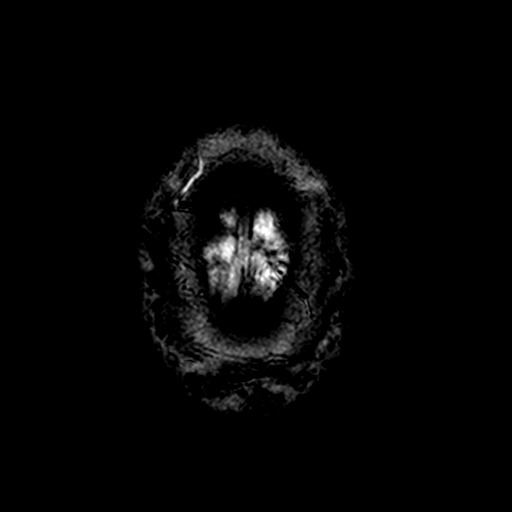

[Series 8: t2_spc_tra_p2_iso · axial · 0.6mm · 0.31mm/px · z∈[-49,-7]mm · 5 of 88 slices shown]
[im 1/88]
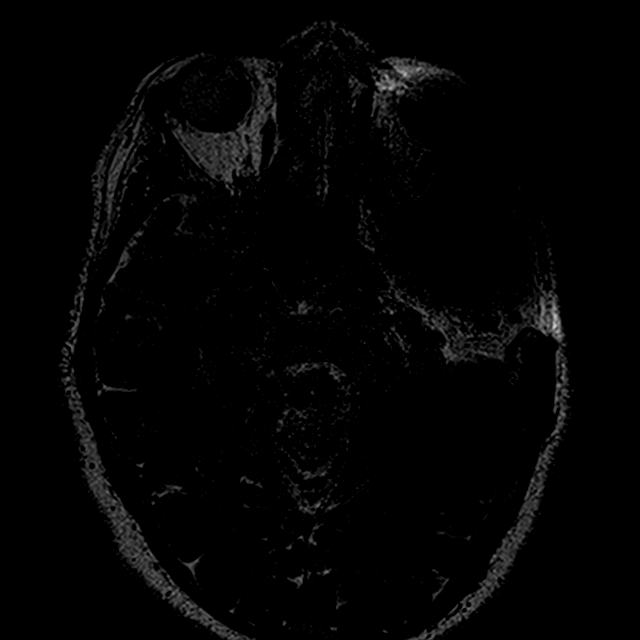
[im 18/88]
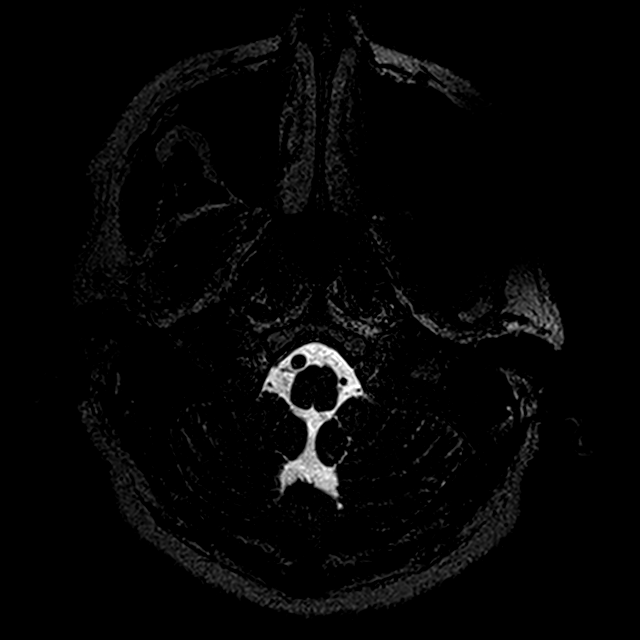
[im 35/88]
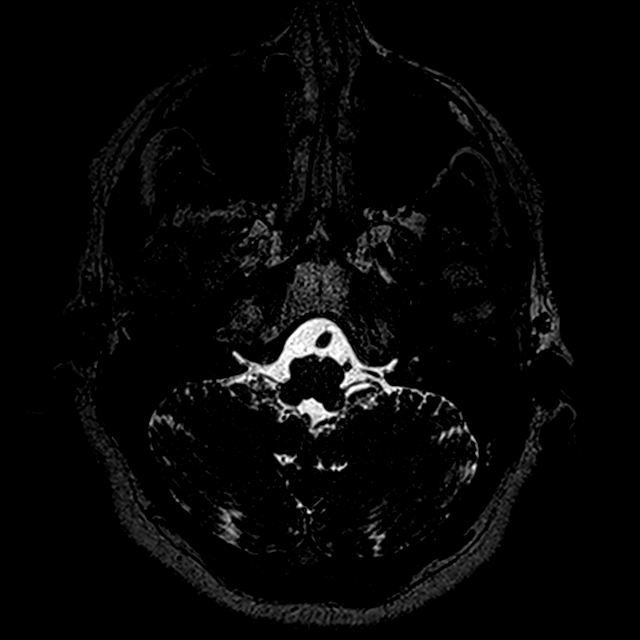
[im 53/88]
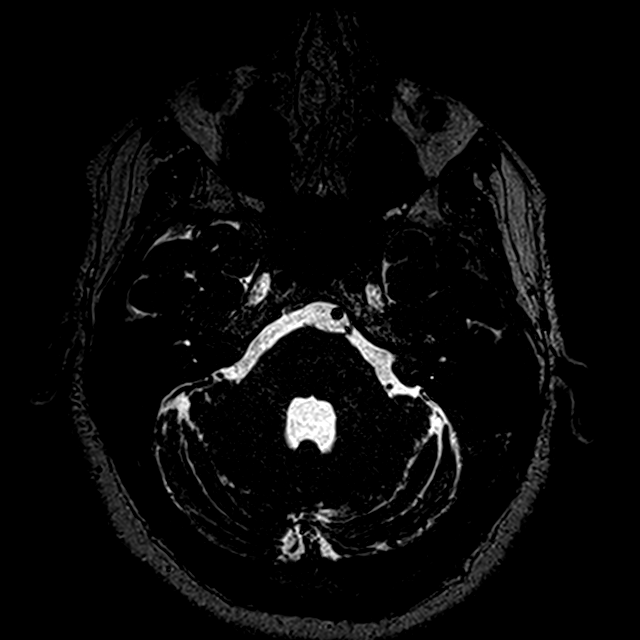
[im 70/88]
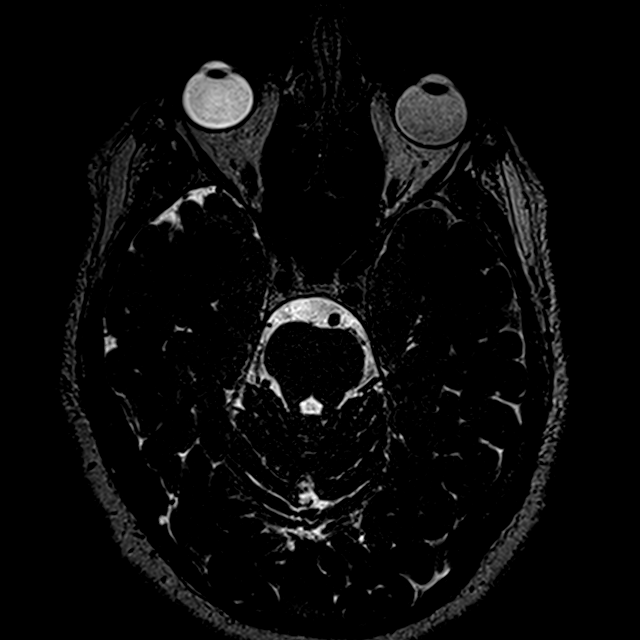

[Series 13: T1 post-contrast · axial · 2.0mm · 0.43mm/px · z∈[-60,+116]mm · 6 of 89 slices shown]
[im 1/89]
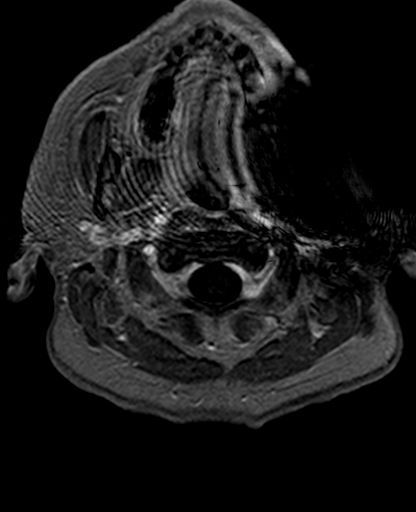
[im 18/89]
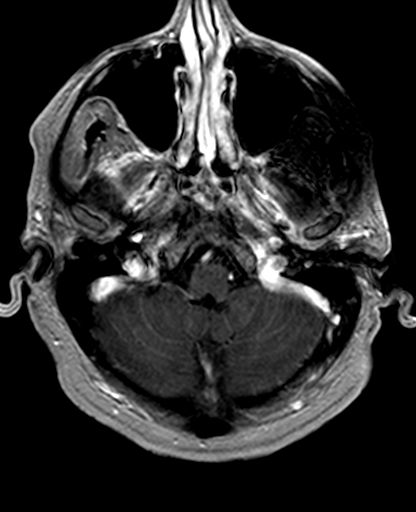
[im 36/89]
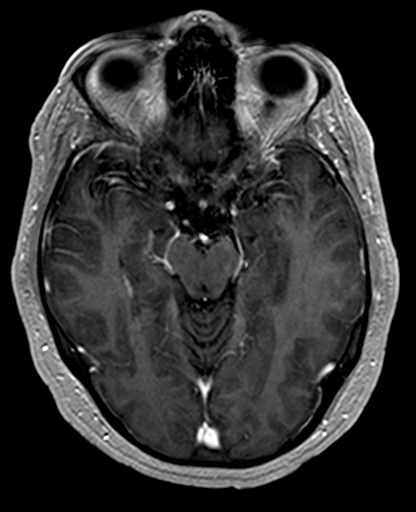
[im 53/89]
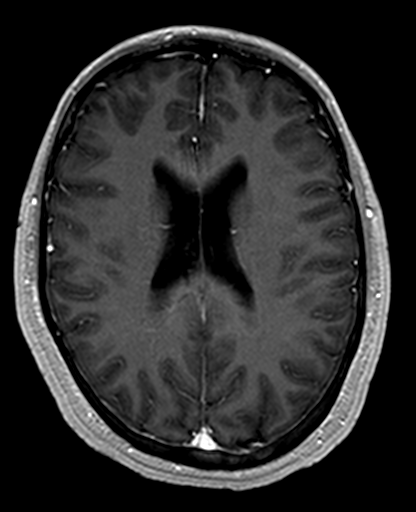
[im 71/89]
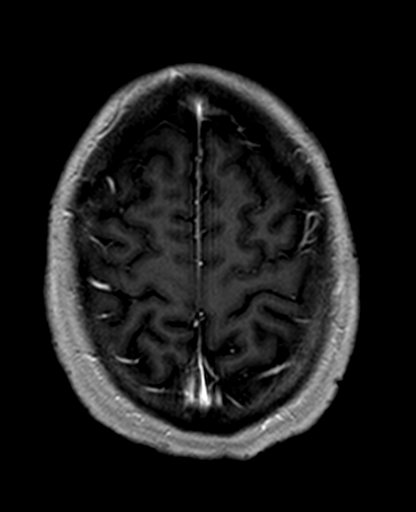
[im 89/89]
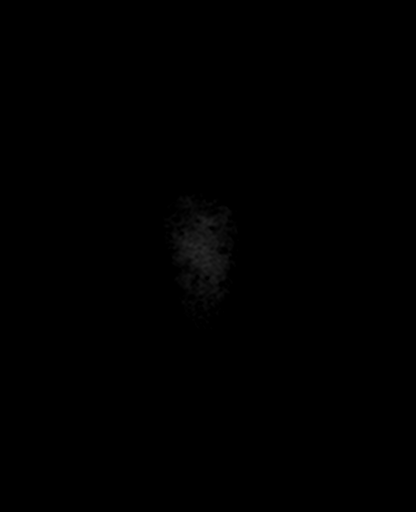

[17 of 48 positions shown; findings below may reference images not displayed]

FINDINGS: IAC protocol was performed including thin section imaging through
the posterior fossa before and after intravenous contrast.

Seventh and eighth cranial nerves normal. Negative for vestibular
schwannoma. Brainstem and cerebellum normal. Basilar cisterns
normal. Mastoid sinus clear bilaterally. No enhancing mass in the
posterior fossa. Normal appearing temporal bone. Cavernous sinus
normal.

Solitary small hyperintensity left frontal white matter is
unchanged. Remainder of the white matter is normal.

Negative for acute infarct

Negative for hemorrhage or mass lesion.

Pituitary normal in size.

Paranasal sinuses clear.  Normal orbit.

Artifact from dental hardware on diffusion.
IMPRESSION: No cause for dizziness identified.  No acute abnormality

Solitary small left frontal white matter unchanged from the prior
MRI.

## 2018-09-07 ENCOUNTER — Other Ambulatory Visit: Payer: Self-pay | Admitting: Family Medicine

## 2018-09-07 NOTE — Telephone Encounter (Signed)
Last seen 02/25/18

## 2018-11-24 ENCOUNTER — Encounter: Payer: Self-pay | Admitting: Family Medicine

## 2018-11-24 ENCOUNTER — Ambulatory Visit: Payer: BLUE CROSS/BLUE SHIELD | Admitting: Family Medicine

## 2018-11-24 VITALS — BP 137/85 | HR 81 | Temp 100.8°F | Ht 67.0 in | Wt 247.0 lb

## 2018-11-24 DIAGNOSIS — R509 Fever, unspecified: Secondary | ICD-10-CM | POA: Diagnosis not present

## 2018-11-24 DIAGNOSIS — J101 Influenza due to other identified influenza virus with other respiratory manifestations: Secondary | ICD-10-CM

## 2018-11-24 LAB — VERITOR FLU A/B WAIVED
INFLUENZA A: POSITIVE — AB
INFLUENZA B: NEGATIVE

## 2018-11-24 MED ORDER — OSELTAMIVIR PHOSPHATE 75 MG PO CAPS: 75.0000 mg | ORAL_CAPSULE | Freq: Two times a day (BID) | ORAL | 0 refills | Status: AC

## 2018-11-24 MED ORDER — GUAIFENESIN-CODEINE 100-10 MG/5ML PO SOLN
5.0000 mL | Freq: Four times a day (QID) | ORAL | 0 refills | Status: DC | PRN
Start: 2018-11-24 — End: 2019-03-25

## 2018-11-24 NOTE — Patient Instructions (Signed)
Positive for flu I have sent you in Robitussin with codeine for cough.  Remind yourself that this does have codeine and can cause sleepiness.  You cannot use this while driving or working.  I have also sent in Tamiflu that you can take twice a day for the next 5 days.  You may experience nausea with this medication and I recommend that you take it with food.  Okay to continue Tylenol or ibuprofen if needed for pain or fever.  Influenza, Adult Influenza is also called "the flu." It is an infection in the lungs, nose, and throat (respiratory tract). It is caused by a virus. The flu causes symptoms that are similar to symptoms of a cold. It also causes a high fever and body aches. The flu spreads easily from person to person (is contagious). Getting a flu shot (influenza vaccination) every year is the best way to prevent the flu. What are the causes? This condition is caused by the influenza virus. You can get the virus by:  Breathing in droplets that are in the air from the cough or sneeze of a person who has the virus.  Touching something that has the virus on it (is contaminated) and then touching your mouth, nose, or eyes. What increases the risk? Certain things may make you more likely to get the flu. These include:  Not washing your hands often.  Having close contact with many people during cold and flu season.  Touching your mouth, eyes, or nose without first washing your hands.  Not getting a flu shot every year. You may have a higher risk for the flu, along with serious problems such as a lung infection (pneumonia), if you:  Are older than 65.  Are pregnant.  Have a weakened disease-fighting system (immune system) because of a disease or taking certain medicines.  Have a long-term (chronic) illness, such as: ? Heart, kidney, or lung disease. ? Diabetes. ? Asthma.  Have a liver disorder.  Are very overweight (morbidly obese).  Have anemia. This is a condition that affects  your red blood cells. What are the signs or symptoms? Symptoms usually begin suddenly and last 4-14 days. They may include:  Fever and chills.  Headaches, body aches, or muscle aches.  Sore throat.  Cough.  Runny or stuffy (congested) nose.  Chest discomfort.  Not wanting to eat as much as normal (poor appetite).  Weakness or feeling tired (fatigue).  Dizziness.  Feeling sick to your stomach (nauseous) or throwing up (vomiting). How is this treated? If the flu is found early, you can be treated with medicine that can help reduce how bad the illness is and how long it lasts (antiviral medicine). This may be given by mouth (orally) or through an IV tube. Taking care of yourself at home can help your symptoms get better. Your doctor may suggest:  Taking over-the-counter medicines.  Drinking plenty of fluids. The flu often goes away on its own. If you have very bad symptoms or other problems, you may be treated in a hospital. Follow these instructions at home:     Activity  Rest as needed. Get plenty of sleep.  Stay home from work or school as told by your doctor. ? Do not leave home until you do not have a fever for 24 hours without taking medicine. ? Leave home only to visit your doctor. Eating and drinking  Take an ORS (oral rehydration solution). This is a drink that is sold at pharmacies and stores.  Drink enough fluid to keep your pee (urine) pale yellow.  Drink clear fluids in small amounts as you are able. Clear fluids include: ? Water. ? Ice chips. ? Fruit juice that has water added (diluted fruit juice). ? Low-calorie sports drinks.  Eat bland, easy-to-digest foods in small amounts as you are able. These foods include: ? Bananas. ? Applesauce. ? Rice. ? Lean meats. ? Toast. ? Crackers.  Do not eat or drink: ? Fluids that have a lot of sugar or caffeine. ? Alcohol. ? Spicy or fatty foods. General instructions  Take over-the-counter and  prescription medicines only as told by your doctor.  Use a cool mist humidifier to add moisture to the air in your home. This can make it easier for you to breathe.  Cover your mouth and nose when you cough or sneeze.  Wash your hands with soap and water often, especially after you cough or sneeze. If you cannot use soap and water, use alcohol-based hand sanitizer.  Keep all follow-up visits as told by your doctor. This is important. How is this prevented?   Get a flu shot every year. You may get the flu shot in late summer, fall, or winter. Ask your doctor when you should get your flu shot.  Avoid contact with people who are sick during fall and winter (cold and flu season). Contact a doctor if:  You get new symptoms.  You have: ? Chest pain. ? Watery poop (diarrhea). ? A fever.  Your cough gets worse.  You start to have more mucus.  You feel sick to your stomach.  You throw up. Get help right away if you:  Have shortness of breath.  Have trouble breathing.  Have skin or nails that turn a bluish color.  Have very bad pain or stiffness in your neck.  Get a sudden headache.  Get sudden pain in your face or ear.  Cannot eat or drink without throwing up. Summary  Influenza ("the flu") is an infection in the lungs, nose, and throat. It is caused by a virus.  Take over-the-counter and prescription medicines only as told by your doctor.  Getting a flu shot every year is the best way to avoid getting the flu. This information is not intended to replace advice given to you by your health care provider. Make sure you discuss any questions you have with your health care provider. Document Released: 07/08/2008 Document Revised: 03/17/2018 Document Reviewed: 03/17/2018 Elsevier Interactive Patient Education  2019 [employer].

## 2018-11-24 NOTE — Progress Notes (Signed)
Subjective: CC: flu PCP: Dettinger, Elige Radon, MD William Mack is a 38 y.o. male presenting to clinic today for:  1. Flu like symptoms Patient reports onset of cough, sore throat, chills and myalgia Monday.  He has been using TheraFlu and Tylenol for symptoms but has not had significant improvement.  He denies any hemoptysis, shortness of breath or wheeze.  No nausea, vomiting or diarrhea.  He is tolerating fluids without difficulty.  He has had 2 sick contacts with positive flu recently.   ROS: Per HPI  No Known Allergies Past Medical History:  Diagnosis Date  . Dizziness and giddiness 11/01/2015  . GERD (gastroesophageal reflux disease)   . Hypertension   . Kidney stones 06/01/2015    Current Outpatient Medications:  .  atorvastatin (LIPITOR) 20 MG tablet, Take 1 tablet (20 mg total) by mouth daily., Disp: 90 tablet, Rfl: 3 .  cyclobenzaprine (FLEXERIL) 5 MG tablet, Take 1 tablet (5 mg total) by mouth 3 (three) times daily as needed for muscle spasms., Disp: 30 tablet, Rfl: 0 .  fluticasone (FLONASE) 50 MCG/ACT nasal spray, Place 2 sprays into both nostrils daily., Disp: 16 g, Rfl: 6 .  lisinopril-hydrochlorothiazide (PRINZIDE,ZESTORETIC) 10-12.5 MG tablet, Take 1 tablet by mouth daily., Disp: 90 tablet, Rfl: 3 .  meclizine (ANTIVERT) 25 MG tablet, TAKE 1 TABLET BY MOUTH THREE TIMES DAILY AS NEEDED FOR DIZZINESS, Disp: 90 tablet, Rfl: 0 .  omeprazole (PRILOSEC) 20 MG capsule, Take 1 capsule (20 mg total) by mouth daily., Disp: 90 capsule, Rfl: 3 Social History   Socioeconomic History  . Marital status: Married    Spouse name: Not on file  . Number of children: 2  . Years of education: 9  . Highest education level: Not on file  Occupational History  . Not on file  Social Needs  . Financial resource strain: Not on file  . Food insecurity:    Worry: Not on file    Inability: Not on file  . Transportation needs:    Medical: Not on file    Non-medical: Not on file    Tobacco Use  . Smoking status: Never Smoker  . Smokeless tobacco: Never Used  Substance and Sexual Activity  . Alcohol use: No    Alcohol/week: 0.0 standard drinks  . Drug use: No  . Sexual activity: Not on file  Lifestyle  . Physical activity:    Days per week: Not on file    Minutes per session: Not on file  . Stress: Not on file  Relationships  . Social connections:    Talks on phone: Not on file    Gets together: Not on file    Attends religious service: Not on file    Active member of club or organization: Not on file    Attends meetings of clubs or organizations: Not on file    Relationship status: Not on file  . Intimate partner violence:    Fear of current or ex partner: Not on file    Emotionally abused: Not on file    Physically abused: Not on file    Forced sexual activity: Not on file  Other Topics Concern  . Not on file  Social History Narrative   Patient no longer drinks caffeine.   Family History  Problem Relation Age of Onset  . Hypertension Mother   . Heart attack Father 3  . Hypertension Father   . Hypertension Brother     Objective: Office vital signs reviewed.  BP 137/85   Pulse 81   Temp (!) 100.8 F (38.2 C) (Oral)   Ht 5\' 7"  (1.702 m)   Wt 247 lb (112 kg)   SpO2 96%   BMI 38.69 kg/m   Physical Examination:  General: Awake, alert, well nourished, No acute distress HEENT: Normal    Neck: No masses palpated. No lymphadenopathy    Ears: Tympanic membranes intact, normal light reflex, no erythema, no bulging    Eyes: extraocular membranes intact, sclera white    Nose: nasal turbinates moist, clear nasal discharge    Throat: moist mucus membranes, no erythema, no tonsillar exudate.  Airway is patent Cardio: regular rate and rhythm, S1S2 heard, no murmurs appreciated Pulm: clear to auscultation bilaterally, no wheezes, rhonchi or rales; normal work of breathing on room air   Assessment/ Plan: 38 y.o. male   1. Influenza A Patient  febrile to 100.8 F here in office.  Vital signs are otherwise within normal limits.  Rapid flu was collected and was positive for influenza A.  Tamiflu 75 mg p.o. twice daily prescribed.  Guaifenesin AC prescribed for cough.  Caution sedation.  Work note provided.  Push oral fluids.  Okay to use Tylenol or ibuprofen if needed for fever or pain.  Home care instructions reviewed and reasons return discussed.  Follow-up PRN. - Veritor Flu A/B Waived - oseltamivir (TAMIFLU) 75 MG capsule; Take 1 capsule (75 mg total) by mouth 2 (two) times daily for 5 days.  Dispense: 10 capsule; Refill: 0 - guaiFENesin-codeine 100-10 MG/5ML syrup; Take 5 mLs by mouth every 6 (six) hours as needed for cough.  Dispense: 100 mL; Refill: 0  The Narcotic Database has been reviewed.  There were no red flags.     Orders Placed This Encounter  Procedures  . Veritor Flu A/B Waived    Order Specific Question:   Source    Answer:   nasal   Meds ordered this encounter  Medications  . oseltamivir (TAMIFLU) 75 MG capsule    Sig: Take 1 capsule (75 mg total) by mouth 2 (two) times daily for 5 days.    Dispense:  10 capsule    Refill:  0  . guaiFENesin-codeine 100-10 MG/5ML syrup    Sig: Take 5 mLs by mouth every 6 (six) hours as needed for cough.    Dispense:  100 mL    Refill:  0     Jeweliana Dudgeon Hulen Skains, DO [facility] [phone]

## 2018-12-27 ENCOUNTER — Other Ambulatory Visit: Payer: Self-pay | Admitting: Family Medicine

## 2019-01-18 ENCOUNTER — Other Ambulatory Visit: Payer: Self-pay | Admitting: Family Medicine

## 2019-01-18 DIAGNOSIS — K219 Gastro-esophageal reflux disease without esophagitis: Secondary | ICD-10-CM

## 2019-03-21 ENCOUNTER — Other Ambulatory Visit: Payer: Self-pay | Admitting: Family Medicine

## 2019-03-21 DIAGNOSIS — K219 Gastro-esophageal reflux disease without esophagitis: Secondary | ICD-10-CM

## 2019-03-23 MED ORDER — OMEPRAZOLE 20 MG PO CPDR: 20.0000 mg | DELAYED_RELEASE_CAPSULE | Freq: Every day | ORAL | 0 refills | Status: DC

## 2019-03-23 MED ORDER — MECLIZINE HCL 25 MG PO TABS: 25.0000 mg | ORAL_TABLET | Freq: Three times a day (TID) | ORAL | 1 refills | Status: DC | PRN

## 2019-03-25 ENCOUNTER — Ambulatory Visit (INDEPENDENT_AMBULATORY_CARE_PROVIDER_SITE_OTHER): Payer: Commercial Managed Care - PPO | Admitting: Family Medicine

## 2019-03-25 ENCOUNTER — Encounter: Payer: Self-pay | Admitting: Family Medicine

## 2019-03-25 DIAGNOSIS — E785 Hyperlipidemia, unspecified: Secondary | ICD-10-CM

## 2019-03-25 DIAGNOSIS — K219 Gastro-esophageal reflux disease without esophagitis: Secondary | ICD-10-CM

## 2019-03-25 DIAGNOSIS — I10 Essential (primary) hypertension: Secondary | ICD-10-CM | POA: Diagnosis not present

## 2019-03-25 MED ORDER — ATORVASTATIN CALCIUM 20 MG PO TABS: 20.0000 mg | ORAL_TABLET | Freq: Every day | ORAL | 3 refills | Status: DC

## 2019-03-25 MED ORDER — OMEPRAZOLE 20 MG PO CPDR: 20.0000 mg | DELAYED_RELEASE_CAPSULE | Freq: Every day | ORAL | 3 refills | Status: DC

## 2019-03-25 MED ORDER — LISINOPRIL-HYDROCHLOROTHIAZIDE 10-12.5 MG PO TABS: 1.0000 | ORAL_TABLET | Freq: Every day | ORAL | 3 refills | Status: DC

## 2019-03-25 NOTE — Progress Notes (Signed)
Virtual Visit via telephone Note  I connected with William HorsfallKendell L Sword on 03/25/19 at 1421 by telephone and verified that I am speaking with the correct person using two identifiers. William Mack is currently located at home and no other people are currently with her during visit. The provider, Elige RadonJoshua A , MD is located in their office at time of visit.  Call ended at 1428  I discussed the limitations, risks, security and privacy concerns of performing an evaluation and management service by telephone and the availability of in person appointments. I also discussed with the patient that there may be a patient responsible charge related to this service. The patient expressed understanding and agreed to proceed.   History and Present Illness: Hypertension Patient is currently on lisinopril-hydroch, and their blood pressure today is 132/82. Patient denies any lightheadedness or dizziness. Patient denies headaches, blurred vision, chest pains, shortness of breath, or weakness. Denies any side effects from medication and is content with current medication.   GERD Patient is currently on ompeprazole.  She denies any major symptoms or abdominal pain or belching or burping. She denies any blood in her stool or lightheadedness or dizziness.   Hyperlipidemia Patient is coming in for recheck of his hyperlipidemia. The patient is currently taking lipitor. They deny any issues with myalgias or history of liver damage from it. They deny any focal numbness or weakness or chest pain.   No diagnosis found.  Outpatient Encounter Medications as of 03/25/2019  Medication Sig  . atorvastatin (LIPITOR) 20 MG tablet Take 1 tablet (20 mg total) by mouth daily.  . cyclobenzaprine (FLEXERIL) 5 MG tablet Take 1 tablet (5 mg total) by mouth 3 (three) times daily as needed for muscle spasms.  . fluticasone (FLONASE) 50 MCG/ACT nasal spray Place 2 sprays into both nostrils daily. (Patient not taking:  Reported on 11/24/2018)  . guaiFENesin-codeine 100-10 MG/5ML syrup Take 5 mLs by mouth every 6 (six) hours as needed for cough.  Marland Kitchen. lisinopril-hydrochlorothiazide (PRINZIDE,ZESTORETIC) 10-12.5 MG tablet Take 1 tablet by mouth daily.  . meclizine (ANTIVERT) 25 MG tablet Take 1 tablet (25 mg total) by mouth 3 (three) times daily as needed for dizziness.  Marland Kitchen. omeprazole (PRILOSEC) 20 MG capsule Take 1 capsule (20 mg total) by mouth daily. (Please make your yearly appt)   No facility-administered encounter medications on file as of 03/25/2019.     Review of Systems  Constitutional: Negative for chills and fever.  Eyes: Negative for visual disturbance.  Respiratory: Negative for shortness of breath and wheezing.   Cardiovascular: Negative for chest pain and leg swelling.  Musculoskeletal: Negative for back pain and gait problem.  Skin: Negative for rash.  Neurological: Negative for dizziness, weakness and light-headedness.  All other systems reviewed and are negative.   Observations/Objective: Patient sounds comfortable and in no acute distress  Assessment and Plan: Problem List Items Addressed This Visit      Cardiovascular and Mediastinum   Essential hypertension   Relevant Medications   lisinopril-hydrochlorothiazide (ZESTORETIC) 10-12.5 MG tablet   atorvastatin (LIPITOR) 20 MG tablet     Digestive   GERD (gastroesophageal reflux disease)   Relevant Medications   omeprazole (PRILOSEC) 20 MG capsule     Other   Hyperlipidemia LDL goal <130 - Primary   Relevant Medications   lisinopril-hydrochlorothiazide (ZESTORETIC) 10-12.5 MG tablet   atorvastatin (LIPITOR) 20 MG tablet       Follow Up Instructions: Patient does not take his atorvastatin consistently but he does  take a blood pressure in the omeprazole consistently and we will send in refills for those.  Follow-up in 6 months for blood work    I discussed the assessment and treatment plan with the patient. The patient was  provided an opportunity to ask questions and all were answered. The patient agreed with the plan and demonstrated an understanding of the instructions.   The patient was advised to call back or seek an in-person evaluation if the symptoms worsen or if the condition fails to improve as anticipated.  The above assessment and management plan was discussed with the patient. The patient verbalized understanding of and has agreed to the management plan. Patient is aware to call the clinic if symptoms persist or worsen. Patient is aware when to return to the clinic for a follow-up visit. Patient educated on when it is appropriate to go to the emergency department.    I provided 7 minutes of non-face-to-face time during this encounter.    Worthy Rancher, MD

## 2019-09-25 ENCOUNTER — Other Ambulatory Visit: Payer: Self-pay | Admitting: Family Medicine

## 2019-12-27 ENCOUNTER — Other Ambulatory Visit: Payer: Self-pay | Admitting: Family Medicine

## 2020-03-25 ENCOUNTER — Other Ambulatory Visit: Payer: Self-pay | Admitting: Family Medicine

## 2020-03-25 DIAGNOSIS — I10 Essential (primary) hypertension: Secondary | ICD-10-CM

## 2020-05-19 ENCOUNTER — Other Ambulatory Visit: Payer: Self-pay | Admitting: Family Medicine

## 2020-05-19 DIAGNOSIS — K219 Gastro-esophageal reflux disease without esophagitis: Secondary | ICD-10-CM

## 2020-05-21 NOTE — Telephone Encounter (Signed)
Dettinger. NTBS LOV 03/25/19

## 2020-05-21 NOTE — Telephone Encounter (Signed)
Scheduled pt with Je 05/31/20 at 3.

## 2020-05-31 ENCOUNTER — Encounter: Payer: Self-pay | Admitting: Nurse Practitioner

## 2020-05-31 ENCOUNTER — Other Ambulatory Visit: Payer: Self-pay

## 2020-05-31 ENCOUNTER — Ambulatory Visit (INDEPENDENT_AMBULATORY_CARE_PROVIDER_SITE_OTHER): Payer: Commercial Managed Care - PPO | Admitting: Nurse Practitioner

## 2020-05-31 VITALS — BP 134/78 | HR 74 | Temp 97.9°F | Ht 67.0 in | Wt 258.0 lb

## 2020-05-31 DIAGNOSIS — Z0000 Encounter for general adult medical examination without abnormal findings: Secondary | ICD-10-CM | POA: Diagnosis not present

## 2020-05-31 DIAGNOSIS — E785 Hyperlipidemia, unspecified: Secondary | ICD-10-CM

## 2020-05-31 DIAGNOSIS — I10 Essential (primary) hypertension: Secondary | ICD-10-CM

## 2020-05-31 DIAGNOSIS — K219 Gastro-esophageal reflux disease without esophagitis: Secondary | ICD-10-CM

## 2020-05-31 MED ORDER — MECLIZINE HCL 25 MG PO TABS: ORAL_TABLET | ORAL | 1 refills | Status: DC

## 2020-05-31 MED ORDER — ATORVASTATIN CALCIUM 20 MG PO TABS: 20.0000 mg | ORAL_TABLET | Freq: Every day | ORAL | 1 refills | Status: DC

## 2020-05-31 MED ORDER — OMEPRAZOLE 20 MG PO CPDR: 20.0000 mg | DELAYED_RELEASE_CAPSULE | Freq: Every day | ORAL | 1 refills | Status: DC

## 2020-05-31 MED ORDER — LISINOPRIL-HYDROCHLOROTHIAZIDE 10-12.5 MG PO TABS: 1.0000 | ORAL_TABLET | Freq: Every day | ORAL | 1 refills | Status: DC

## 2020-05-31 NOTE — Assessment & Plan Note (Signed)
Concerning patient's essential hypertension, first diagnosed 08/06/2015.  Patient is well managed.  No changes to current medication.  Patient is tolerating medication well.  With compliance to medication administration and  no side effects.  Patient continues to maintain a low-sodium diet and tries to exercise at least twice weekly.  And follows up as directed. Current blood pressure medication lisinopril-hydrochlorothiazide 10-12.5 mg tablet daily.  Rx refill sent to pharmacy.

## 2020-05-31 NOTE — Assessment & Plan Note (Signed)
Patient is a 39 year old male who presents to clinic for an annual physical exam.  Completed head to toe assessment.  Patient has no new concerns.  Provided education on health maintenance, preventative health and weight loss.  BMI elevated 40.41 kg, total weight of 258 pounds.  Printed handouts given to patient. Patient will follow up in 1 year. Labs completed today CBC, CMP, lipid panel.  Lab pending Rx refill sent to pharmacy.

## 2020-05-31 NOTE — Patient Instructions (Signed)
Health Maintenance, Male Adopting a healthy lifestyle and getting preventive care are important in promoting health and wellness. Ask your health care provider about:  The right schedule for you to have regular tests and exams.  Things you can do on your own to prevent diseases and keep yourself healthy. What should I know about diet, weight, and exercise? Eat a healthy diet   Eat a diet that includes plenty of vegetables, fruits, low-fat dairy products, and lean protein.  Do not eat a lot of foods that are high in solid fats, added sugars, or sodium. Maintain a healthy weight Body mass index (BMI) is a measurement that can be used to identify possible weight problems. It estimates body fat based on height and weight. Your health care provider can help determine your BMI and help you achieve or maintain a healthy weight. Get regular exercise Get regular exercise. This is one of the most important things you can do for your health. Most adults should:  Exercise for at least 150 minutes each week. The exercise should increase your heart rate and make you sweat (moderate-intensity exercise).  Do strengthening exercises at least twice a week. This is in addition to the moderate-intensity exercise.  Spend less time sitting. Even light physical activity can be beneficial. Watch cholesterol and blood lipids Have your blood tested for lipids and cholesterol at 40 years of age, then have this test every 5 years. You may need to have your cholesterol levels checked more often if:  Your lipid or cholesterol levels are high.  You are older than 40 years of age.  You are at high risk for heart disease. What should I know about cancer screening? Many types of cancers can be detected early and may often be prevented. Depending on your health history and family history, you may need to have cancer screening at various ages. This may include screening for:  Colorectal cancer.  Prostate  cancer.  Skin cancer.  Lung cancer. What should I know about heart disease, diabetes, and high blood pressure? Blood pressure and heart disease  High blood pressure causes heart disease and increases the risk of stroke. This is more likely to develop in people who have high blood pressure readings, are of African descent, or are overweight.  Talk with your health care provider about your target blood pressure readings.  Have your blood pressure checked: ? Every 3-5 years if you are 30-46 years of age. ? Every year if you are 78 years old or older.  If you are between the ages of 69 and 47 and are a current or former smoker, ask your health care provider if you should have a one-time screening for abdominal aortic aneurysm (AAA). Diabetes Have regular diabetes screenings. This checks your fasting blood sugar level. Have the screening done:  Once every three years after age 60 if you are at a normal weight and have a low risk for diabetes.  More often and at a younger age if you are overweight or have a high risk for diabetes. What should I know about preventing infection? Hepatitis B If you have a higher risk for hepatitis B, you should be screened for this virus. Talk with your health care provider to find out if you are at risk for hepatitis B infection. Hepatitis C Blood testing is recommended for:  Everyone born from [birth year] through 1966.  Anyone with known risk factors for hepatitis C. Sexually transmitted infections (STIs)  You should be screened each year  for STIs, including gonorrhea and chlamydia, if: ? You are sexually active and are younger than 40 years of age. ? You are older than 40 years of age and your health care provider tells you that you are at risk for this type of infection. ? Your sexual activity has changed since you were last screened, and you are at increased risk for chlamydia or gonorrhea. Ask your health care provider if you are at risk.  Ask your  health care provider about whether you are at high risk for HIV. Your health care provider may recommend a prescription medicine to help prevent HIV infection. If you choose to take medicine to prevent HIV, you should first get tested for HIV. You should then be tested every 3 months for as long as you are taking the medicine. Follow these instructions at home: Lifestyle  Do not use any products that contain nicotine or tobacco, such as cigarettes, e-cigarettes, and chewing tobacco. If you need help quitting, ask your health care provider.  Do not use street drugs.  Do not share needles.  Ask your health care provider for help if you need support or information about quitting drugs. Alcohol use  Do not drink alcohol if your health care provider tells you not to drink.  If you drink alcohol: ? Limit how much you have to 0-2 drinks a day. ? Be aware of how much alcohol is in your drink. In the [place] one drink equals one 12 oz bottle of beer (355 mL), one 5 oz glass of wine (148 mL), or one 1 oz glass of hard liquor (44 mL). General instructions  Schedule regular health, dental, and eye exams.  Stay current with your vaccines.  Tell your health care provider if: ? You often feel depressed. ? You have ever been abused or do not feel safe at home. Summary  Adopting a healthy lifestyle and getting preventive care are important in promoting health and wellness.  Follow your health care provider's instructions about healthy diet, exercising, and getting tested or screened for diseases.  Follow your health care provider's instructions on monitoring your cholesterol and blood pressure. This information is not intended to replace advice given to you by your health care provider. Make sure you discuss any questions you have with your health care provider. Document Revised: 09/22/2018 Document Reviewed: 09/22/2018 Elsevier Patient Education  2020 [employer]. Hypertension,  Adult Hypertension is another name for high blood pressure. High blood pressure forces your heart to work harder to pump blood. This can cause problems over time. There are two numbers in a blood pressure reading. There is a top number (systolic) over a bottom number (diastolic). It is best to have a blood pressure that is below 120/80. Healthy choices can help lower your blood pressure, or you may need medicine to help lower it. What are the causes? The cause of this condition is not known. Some conditions may be related to high blood pressure. What increases the risk?  Smoking.  Having type 2 diabetes mellitus, high cholesterol, or both.  Not getting enough exercise or physical activity.  Being overweight.  Having too much fat, sugar, calories, or salt (sodium) in your diet.  Drinking too much alcohol.  Having long-term (chronic) kidney disease.  Having a family history of high blood pressure.  Age. Risk increases with age.  Race. You may be at higher risk if you are African American.  Gender. Men are at higher risk than women before age 76.  After age 3, women are at higher risk than men.  Having obstructive sleep apnea.  Stress. What are the signs or symptoms?  High blood pressure may not cause symptoms. Very high blood pressure (hypertensive crisis) may cause: ? Headache. ? Feelings of worry or nervousness (anxiety). ? Shortness of breath. ? Nosebleed. ? A feeling of being sick to your stomach (nausea). ? Throwing up (vomiting). ? Changes in how you see. ? Very bad chest pain. ? Seizures. How is this treated?  This condition is treated by making healthy lifestyle changes, such as: ? Eating healthy foods. ? Exercising more. ? Drinking less alcohol.  Your health care provider may prescribe medicine if lifestyle changes are not enough to get your blood pressure under control, and if: ? Your top number is above 130. ? Your bottom number is above 80.  Your  personal target blood pressure may vary. Follow these instructions at home: Eating and drinking   If told, follow the DASH eating plan. To follow this plan: ? Fill one half of your plate at each meal with fruits and vegetables. ? Fill one fourth of your plate at each meal with whole grains. Whole grains include whole-wheat pasta, brown rice, and whole-grain bread. ? Eat or drink low-fat dairy products, such as skim milk or low-fat yogurt. ? Fill one fourth of your plate at each meal with low-fat (lean) proteins. Low-fat proteins include fish, chicken without skin, eggs, beans, and tofu. ? Avoid fatty meat, cured and processed meat, or chicken with skin. ? Avoid pre-made or processed food.  Eat less than 1,500 mg of salt each day.  Do not drink alcohol if: ? Your doctor tells you not to drink. ? You are pregnant, may be pregnant, or are planning to become pregnant.  If you drink alcohol: ? Limit how much you use to:  0-1 drink a day for women.  0-2 drinks a day for men. ? Be aware of how much alcohol is in your drink. In the [place] one drink equals one 12 oz bottle of beer (355 mL), one 5 oz glass of wine (148 mL), or one 1 oz glass of hard liquor (44 mL). Lifestyle   Work with your doctor to stay at a healthy weight or to lose weight. Ask your doctor what the best weight is for you.  Get at least 30 minutes of exercise most days of the week. This may include walking, swimming, or biking.  Get at least 30 minutes of exercise that strengthens your muscles (resistance exercise) at least 3 days a week. This may include lifting weights or doing Pilates.  Do not use any products that contain nicotine or tobacco, such as cigarettes, e-cigarettes, and chewing tobacco. If you need help quitting, ask your doctor.  Check your blood pressure at home as told by your doctor.  Keep all follow-up visits as told by your doctor. This is important. Medicines  Take over-the-counter and  prescription medicines only as told by your doctor. Follow directions carefully.  Do not skip doses of blood pressure medicine. The medicine does not work as well if you skip doses. Skipping doses also puts you at risk for problems.  Ask your doctor about side effects or reactions to medicines that you should watch for. Contact a doctor if you:  Think you are having a reaction to the medicine you are taking.  Have headaches that keep coming back (recurring).  Feel dizzy.  Have swelling in your ankles.  Have  trouble with your vision. Get help right away if you:  Get a very bad headache.  Start to feel mixed up (confused).  Feel weak or numb.  Feel faint.  Have very bad pain in your: ? Chest. ? Belly (abdomen).  Throw up more than once.  Have trouble breathing. Summary  Hypertension is another name for high blood pressure.  High blood pressure forces your heart to work harder to pump blood.  For most people, a normal blood pressure is less than 120/80.  Making healthy choices can help lower blood pressure. If your blood pressure does not get lower with healthy choices, you may need to take medicine. This information is not intended to replace advice given to you by your health care provider. Make sure you discuss any questions you have with your health care provider. Document Revised: 06/09/2018 Document Reviewed: 06/09/2018 Elsevier Patient Education  2020 [employer].

## 2020-05-31 NOTE — Progress Notes (Signed)
Established Patient Office Visit  Subjective:  Patient ID: William Mack, male    DOB: 1980/12/21  Age: 39 y.o. MRN: [id number]  CC:  Chief Complaint  Patient presents with  . Annual Exam    HPI William Mack presents for .  Annual exam Encounter for general adult medical examination without abnormal findings  Physical: Patient's last physical exam was 2 year ago .  Weight: Not appropriate for height (BMI greater than 27%) ;  Blood Pressure: Normal (BP less than 120/80) ;  Medical History: Patient history reviewed ; Family history reviewed ;  Allergies Reviewed: No change in current allergies ;  Medications Reviewed: Medications reviewed - no changes ;  Lipids: Normal lipid levels ;  Smoking: Life-long non-smoker ;  Physical Activity: Exercises at least 2 times per week ;  Alcohol/Drug Use: Is a non-drinker ; No illicit drug use ;  Patient is not afflicted from Stress Incontinence and Urge Incontinence  Safety: reviewed ; Patient wears a seat belt, has smoke detectors, has carbon monoxide detectors, practices appropriate gun safety, and does not wears sunscreen with extended sun exposure. Dental Care: annual cleanings, brushes and flosses daily. Ophthalmology/Optometry: Annual visit Hearing loss: none Vision impairments: none  Past Medical History:  Diagnosis Date  . Dizziness and giddiness 11/01/2015  . GERD (gastroesophageal reflux disease)   . Hypertension   . Kidney stones 06/01/2015    Past Surgical History:  Procedure Laterality Date  . None      Family History  Problem Relation Age of Onset  . Hypertension Mother   . Heart attack Father 22  . Hypertension Father   . Hypertension Brother     Social History   Socioeconomic History  . Marital status: Married    Spouse name: Not on file  . Number of children: 2  . Years of education: 9  . Highest education level: Not on file  Occupational History  . Not on file  Tobacco Use  . Smoking  status: Never Smoker  . Smokeless tobacco: Never Used  Substance and Sexual Activity  . Alcohol use: No    Alcohol/week: 0.0 standard drinks  . Drug use: No  . Sexual activity: Not on file  Other Topics Concern  . Not on file  Social History Narrative   Patient no longer drinks caffeine.   Social Determinants of Health   Financial Resource Strain:   . Difficulty of Paying Living Expenses: Not on file  Food Insecurity:   . Worried About Programme researcher, broadcasting/film/video in the Last Year: Not on file  . Ran Out of Food in the Last Year: Not on file  Transportation Needs:   . Lack of Transportation (Medical): Not on file  . Lack of Transportation (Non-Medical): Not on file  Physical Activity:   . Days of Exercise per Week: Not on file  . Minutes of Exercise per Session: Not on file  Stress:   . Feeling of Stress : Not on file  Social Connections:   . Frequency of Communication with Friends and Family: Not on file  . Frequency of Social Gatherings with Friends and Family: Not on file  . Attends Religious Services: Not on file  . Active Member of Clubs or Organizations: Not on file  . Attends Banker Meetings: Not on file  . Marital Status: Not on file  Intimate Partner Violence:   . Fear of Current or Ex-Partner: Not on file  . Emotionally Abused: Not on  file  . Physically Abused: Not on file  . Sexually Abused: Not on file    Outpatient Medications Prior to Visit  Medication Sig Dispense Refill  . atorvastatin (LIPITOR) 20 MG tablet Take 1 tablet (20 mg total) by mouth daily. 90 tablet 3  . lisinopril-hydrochlorothiazide (ZESTORETIC) 10-12.5 MG tablet Take 1 tablet by mouth once daily 90 tablet 1  . meclizine (ANTIVERT) 25 MG tablet TAKE 1 TABLET BY MOUTH THREE TIMES DAILY AS NEEDED FOR DIZZINESS 90 tablet 0  . omeprazole (PRILOSEC) 20 MG capsule Take 1 capsule (20 mg total) by mouth daily. (Please make your yearly appt) 90 capsule 3  . fluticasone (FLONASE) 50 MCG/ACT  nasal spray Place 2 sprays into both nostrils daily. (Patient not taking: Reported on 11/24/2018) 16 g 6   No facility-administered medications prior to visit.    No Known Allergies  ROS Review of Systems  Constitutional: Negative.   HENT: Negative.   Respiratory: Negative.   Cardiovascular: Negative.   Gastrointestinal: Negative.   Genitourinary: Negative.   Musculoskeletal: Negative.   Skin: Negative.   Neurological: Negative.   Psychiatric/Behavioral: Negative.       Objective:    Physical Exam Vitals reviewed.  Constitutional:      Appearance: Normal appearance. He is obese.  HENT:     Head: Normocephalic.     Right Ear: External ear normal. There is no impacted cerumen.     Left Ear: External ear normal. There is no impacted cerumen.     Mouth/Throat:     Pharynx: Oropharynx is clear. No oropharyngeal exudate.  Eyes:     Conjunctiva/sclera: Conjunctivae normal.  Cardiovascular:     Rate and Rhythm: Normal rate and regular rhythm.     Pulses: Normal pulses.     Heart sounds: Normal heart sounds.  Pulmonary:     Effort: Pulmonary effort is normal.     Breath sounds: Normal breath sounds.  Abdominal:     General: Bowel sounds are normal.     Tenderness: There is no abdominal tenderness.  Musculoskeletal:        General: No swelling or tenderness. Normal range of motion.     Cervical back: Normal range of motion and neck supple. No tenderness.  Skin:    General: Skin is warm.  Neurological:     Mental Status: He is alert and oriented to person, place, and time.  Psychiatric:        Mood and Affect: Mood normal.        Behavior: Behavior normal.     BP 134/78   Pulse 74   Temp 97.9 F (36.6 C) (Temporal)   Ht 5\' 7"  (1.702 m)   Wt 258 lb (117 kg)   SpO2 98%   BMI 40.41 kg/m  Wt Readings from Last 3 Encounters:  05/31/20 258 lb (117 kg)  11/24/18 247 lb (112 kg)  02/25/18 228 lb 8 oz (103.6 kg)      Lab Results  Component Value Date   TSH  2.810 09/27/2015   Lab Results  Component Value Date   WBC 8.4 02/25/2018   HGB 14.0 02/25/2018   HCT 43.1 02/25/2018   MCV 97 02/25/2018   PLT 318 02/25/2018   Lab Results  Component Value Date   NA 142 02/25/2018   K 3.9 02/25/2018   CO2 24 02/25/2018   GLUCOSE 80 02/25/2018   BUN 21 (H) 02/25/2018   CREATININE 1.16 02/25/2018   BILITOT 0.3 02/25/2018  ALKPHOS 37 (L) 02/25/2018   AST 23 02/25/2018   ALT 24 02/25/2018   PROT 6.8 02/25/2018   ALBUMIN 4.4 02/25/2018   CALCIUM 9.5 02/25/2018   Lab Results  Component Value Date   CHOL 193 02/25/2018   Lab Results  Component Value Date   HDL 44 02/25/2018   Lab Results  Component Value Date   LDLCALC 130 (H) 02/25/2018   Lab Results  Component Value Date   TRIG 95 02/25/2018   Lab Results  Component Value Date   CHOLHDL 4.4 02/25/2018   No results found for: HGBA1C    Assessment & Plan:   Annual physical exam Patient is a 39 year old male who presents to clinic for an annual physical exam.  Completed head to toe assessment.  Patient has no new concerns.  Provided education on health maintenance, preventative health and weight loss.  BMI elevated 40.41 kg, total weight of 258 pounds.  Printed handouts given to patient. Patient will follow up in 1 year. Labs completed today CBC, CMP, lipid panel.  Lab pending Rx refill sent to pharmacy.  GERD (gastroesophageal reflux disease) Concerning patient's GERD.  Patient is reporting well-managed symptoms.  No changes to medication dose.  Essential hypertension Concerning patient's essential hypertension, first diagnosed 08/06/2015.  Patient is well managed.  No changes to current medication.  Patient is tolerating medication well.  With compliance to medication administration and  no side effects.  Patient continues to maintain a low-sodium diet and tries to exercise at least twice weekly.  And follows up as directed. Current blood pressure medication  lisinopril-hydrochlorothiazide 10-12.5 mg tablet daily.  Rx refill sent to pharmacy.  Problem List Items Addressed This Visit      Cardiovascular and Mediastinum   Essential hypertension   Relevant Medications   lisinopril-hydrochlorothiazide (ZESTORETIC) 10-12.5 MG tablet   atorvastatin (LIPITOR) 20 MG tablet     Digestive   GERD (gastroesophageal reflux disease)   Relevant Medications   omeprazole (PRILOSEC) 20 MG capsule   meclizine (ANTIVERT) 25 MG tablet     Other   Hyperlipidemia LDL goal <130   Relevant Medications   lisinopril-hydrochlorothiazide (ZESTORETIC) 10-12.5 MG tablet   atorvastatin (LIPITOR) 20 MG tablet   Other Relevant Orders   Lipid Panel   Annual physical exam - Primary   Relevant Orders   CBC with Differential   Comprehensive metabolic panel      Meds ordered this encounter  Medications  . lisinopril-hydrochlorothiazide (ZESTORETIC) 10-12.5 MG tablet    Sig: Take 1 tablet by mouth daily.    Dispense:  90 tablet    Refill:  1  . omeprazole (PRILOSEC) 20 MG capsule    Sig: Take 1 capsule (20 mg total) by mouth daily. (Please make your yearly appt)    Dispense:  90 capsule    Refill:  1  . atorvastatin (LIPITOR) 20 MG tablet    Sig: Take 1 tablet (20 mg total) by mouth daily.    Dispense:  90 tablet    Refill:  1  . meclizine (ANTIVERT) 25 MG tablet    Sig: TAKE 1 TABLET BY MOUTH THREE TIMES DAILY AS NEEDED FOR DIZZINESS    Dispense:  90 tablet    Refill:  1    Follow-up: Return in about 6 months (around 12/01/2020).    Daryll Drown, NP

## 2020-05-31 NOTE — Assessment & Plan Note (Signed)
Concerning patient's GERD.  Patient is reporting well-managed symptoms.  No changes to medication dose.

## 2020-06-01 LAB — CBC WITH DIFFERENTIAL/PLATELET
Basophils Absolute: 0.1 10*3/uL (ref 0.0–0.2)
Basos: 2 %
EOS (ABSOLUTE): 0.2 10*3/uL (ref 0.0–0.4)
Eos: 2 %
Hematocrit: 46.2 % (ref 37.5–51.0)
Hemoglobin: 15.1 g/dL (ref 13.0–17.7)
Immature Grans (Abs): 0 10*3/uL (ref 0.0–0.1)
Immature Granulocytes: 0 %
Lymphocytes Absolute: 3.1 10*3/uL (ref 0.7–3.1)
Lymphs: 36 %
MCH: 32.1 pg (ref 26.6–33.0)
MCHC: 32.7 g/dL (ref 31.5–35.7)
MCV: 98 fL — ABNORMAL HIGH (ref 79–97)
Monocytes Absolute: 0.7 10*3/uL (ref 0.1–0.9)
Monocytes: 8 %
Neutrophils Absolute: 4.4 10*3/uL (ref 1.4–7.0)
Neutrophils: 52 %
Platelets: 279 10*3/uL (ref 150–450)
RBC: 4.7 x10E6/uL (ref 4.14–5.80)
RDW: 12.6 % (ref 11.6–15.4)
WBC: 8.6 10*3/uL (ref 3.4–10.8)

## 2020-06-01 LAB — LIPID PANEL
Chol/HDL Ratio: 5.6 ratio — ABNORMAL HIGH (ref 0.0–5.0)
Cholesterol, Total: 225 mg/dL — ABNORMAL HIGH (ref 100–199)
HDL: 40 mg/dL (ref >39)
LDL Chol Calc (NIH): 151 mg/dL — ABNORMAL HIGH (ref 0–99)
Triglycerides: 187 mg/dL — ABNORMAL HIGH (ref 0–149)
VLDL Cholesterol Cal: 34 mg/dL (ref 5–40)

## 2020-06-01 LAB — COMPREHENSIVE METABOLIC PANEL
ALT: 40 IU/L (ref 0–44)
AST: 22 IU/L (ref 0–40)
Albumin/Globulin Ratio: 1.9 (ref 1.2–2.2)
Albumin: 4.5 g/dL (ref 4.0–5.0)
Alkaline Phosphatase: 50 IU/L (ref 48–121)
BUN/Creatinine Ratio: 17 (ref 9–20)
BUN: 22 mg/dL (ref 6–24)
Bilirubin Total: 0.3 mg/dL (ref 0.0–1.2)
CO2: 20 mmol/L (ref 20–29)
Calcium: 9.2 mg/dL (ref 8.7–10.2)
Chloride: 102 mmol/L (ref 96–106)
Creatinine, Ser: 1.29 mg/dL — ABNORMAL HIGH (ref 0.76–1.27)
GFR calc Af Amer: 80 mL/min/{1.73_m2} (ref >59)
GFR calc non Af Amer: 69 mL/min/{1.73_m2} (ref >59)
Globulin, Total: 2.4 g/dL (ref 1.5–4.5)
Glucose: 91 mg/dL (ref 65–99)
Potassium: 4.2 mmol/L (ref 3.5–5.2)
Sodium: 140 mmol/L (ref 134–144)
Total Protein: 6.9 g/dL (ref 6.0–8.5)

## 2020-12-29 ENCOUNTER — Encounter: Payer: Self-pay | Admitting: Nurse Practitioner

## 2021-01-01 ENCOUNTER — Other Ambulatory Visit: Payer: Self-pay

## 2021-01-01 DIAGNOSIS — I10 Essential (primary) hypertension: Secondary | ICD-10-CM

## 2021-01-01 MED ORDER — LISINOPRIL-HYDROCHLOROTHIAZIDE 10-12.5 MG PO TABS: 1.0000 | ORAL_TABLET | Freq: Every day | ORAL | 0 refills | Status: DC

## 2021-01-01 NOTE — Telephone Encounter (Signed)
  Prescription Request  01/01/2021  What is the name of the medication or equipment? B/p & dizziness=2 meds (not sure of names)  Have you contacted your pharmacy to request a refill? (if applicable) yes, twice   Which pharmacy would you like this sent to? [facility]   Patient notified that their request is being sent to the clinical staff for review and that they should receive a response within 2 business days.   [facility] pt  Needs refills until he can get to his appt in May.  Please call pt.

## 2021-01-02 MED ORDER — MECLIZINE HCL 25 MG PO TABS: ORAL_TABLET | ORAL | 1 refills | Status: DC

## 2021-03-06 ENCOUNTER — Encounter: Payer: Self-pay | Admitting: Family Medicine

## 2021-03-06 ENCOUNTER — Ambulatory Visit: Payer: Commercial Managed Care - PPO | Admitting: Family Medicine

## 2021-03-06 ENCOUNTER — Other Ambulatory Visit: Payer: Self-pay

## 2021-03-06 DIAGNOSIS — K219 Gastro-esophageal reflux disease without esophagitis: Secondary | ICD-10-CM | POA: Diagnosis not present

## 2021-03-06 DIAGNOSIS — E785 Hyperlipidemia, unspecified: Secondary | ICD-10-CM

## 2021-03-06 DIAGNOSIS — I10 Essential (primary) hypertension: Secondary | ICD-10-CM | POA: Diagnosis not present

## 2021-03-06 MED ORDER — LISINOPRIL-HYDROCHLOROTHIAZIDE 10-12.5 MG PO TABS: 1.0000 | ORAL_TABLET | Freq: Every day | ORAL | 3 refills | Status: DC

## 2021-03-06 MED ORDER — ATORVASTATIN CALCIUM 20 MG PO TABS: 20.0000 mg | ORAL_TABLET | Freq: Every day | ORAL | 3 refills | Status: DC

## 2021-03-06 MED ORDER — OMEPRAZOLE 20 MG PO CPDR: 20.0000 mg | DELAYED_RELEASE_CAPSULE | Freq: Every day | ORAL | 3 refills | Status: DC

## 2021-03-06 NOTE — Progress Notes (Signed)
BP 134/77   Pulse 69   Temp (!) 97.5 F (36.4 C)   Resp 20   Ht _0  (1.702 m)   Wt 263 lb (119.3 kg)   SpO2 99%   BMI 41.19 kg/m    Subjective:   Patient ID: William Mack, male    DOB: June 21, 1981, 40 y.o.   MRN: [id number]  HPI: William Mack is a 40 y.o. male presenting on 03/06/2021 for Medical Management of Chronic Issues   HPI Hypertension Patient is currently on lisinopril hydrochlorothiazide, and their blood pressure today is 134/77. Patient denies any lightheadedness or dizziness. Patient denies headaches, blurred vision, chest pains, shortness of breath, or weakness. Denies any side effects from medication and is content with current medication.   Hyperlipidemia Patient is coming in for recheck of his hyperlipidemia. The patient is currently taking atorvastatin. They deny any issues with myalgias or history of liver damage from it. They deny any focal numbness or weakness or chest pain.   GERD Patient is currently on omeprazole.  She denies any major symptoms or abdominal pain or belching or burping. She denies any blood in her stool or lightheadedness or dizziness.   Relevant past medical, surgical, family and social history reviewed and updated as indicated. Interim medical history since our last visit reviewed. Allergies and medications reviewed and updated.  Review of Systems  Constitutional: Negative for chills and fever.  HENT: Negative for ear pain and tinnitus.   Eyes: Negative for pain and discharge.  Respiratory: Negative for cough, shortness of breath and wheezing.   Cardiovascular: Negative for chest pain, palpitations and leg swelling.  Gastrointestinal: Negative for abdominal pain, blood in stool, constipation and diarrhea.  Genitourinary: Negative for dysuria and hematuria.  Musculoskeletal: Negative for back pain, gait problem and myalgias.  Skin: Negative for rash.  Neurological: Negative for dizziness, weakness and headaches.   Psychiatric/Behavioral: Negative for suicidal ideas.  All other systems reviewed and are negative.   Per HPI unless specifically indicated above   Allergies as of 03/06/2021   No Known Allergies     Medication List       Accurate as of Mar 06, 2021  2:19 PM. If you have any questions, ask your nurse or doctor.        atorvastatin 20 MG tablet Commonly known as: LIPITOR Take 1 tablet (20 mg total) by mouth daily.   lisinopril-hydrochlorothiazide 10-12.5 MG tablet Commonly known as: ZESTORETIC Take 1 tablet by mouth daily.   meclizine 25 MG tablet Commonly known as: ANTIVERT TAKE 1 TABLET BY MOUTH THREE TIMES DAILY AS NEEDED FOR DIZZINESS   omeprazole 20 MG capsule Commonly known as: PRILOSEC Take 1 capsule (20 mg total) by mouth daily. (Please make your yearly appt)        Objective:   BP 134/77   Pulse 69   Temp (!) 97.5 F (36.4 C)   Resp 20   Ht _1  (1.702 m)   Wt 263 lb (119.3 kg)   SpO2 99%   BMI 41.19 kg/m   Wt Readings from Last 3 Encounters:  03/06/21 263 lb (119.3 kg)  05/31/20 258 lb (117 kg)  11/24/18 247 lb (112 kg)    Physical Exam Vitals and nursing note reviewed.  Constitutional:      General: He is not in acute distress.    Appearance: He is well-developed. He is not diaphoretic.  Eyes:     General: No scleral icterus.    Conjunctiva/sclera:  Conjunctivae normal.  Neck:     Thyroid: No thyromegaly.  Cardiovascular:     Rate and Rhythm: Normal rate and regular rhythm.     Heart sounds: Normal heart sounds. No murmur heard.   Pulmonary:     Effort: Pulmonary effort is normal. No respiratory distress.     Breath sounds: Normal breath sounds. No wheezing.  Abdominal:     General: Abdomen is flat. Bowel sounds are normal. There is no distension.     Tenderness: There is no abdominal tenderness.  Musculoskeletal:        General: No swelling. Normal range of motion.     Cervical back: Neck supple.  Lymphadenopathy:      Cervical: No cervical adenopathy.  Skin:    General: Skin is warm and dry.     Findings: No rash.  Neurological:     Mental Status: He is alert and oriented to person, place, and time.     Coordination: Coordination normal.  Psychiatric:        Behavior: Behavior normal.       Assessment & Plan:   Problem List Items Addressed This Visit      Cardiovascular and Mediastinum   Essential hypertension   Relevant Medications   atorvastatin (LIPITOR) 20 MG tablet   lisinopril-hydrochlorothiazide (ZESTORETIC) 10-12.5 MG tablet   Other Relevant Orders   CMP14+EGFR     Digestive   GERD (gastroesophageal reflux disease)   Relevant Medications   omeprazole (PRILOSEC) 20 MG capsule   Other Relevant Orders   CBC with Differential/Platelet     Other   Hyperlipidemia LDL goal <130   Relevant Medications   atorvastatin (LIPITOR) 20 MG tablet   lisinopril-hydrochlorothiazide (ZESTORETIC) 10-12.5 MG tablet   Other Relevant Orders   Lipid panel      Hyperlipidemia and GERD are doing well, see back in 6 months, will do blood work today.  Continue current medicines. Follow up plan: Return in about 6 months (around 09/06/2021), or if symptoms worsen or fail to improve, for Annual physical.  Counseling provided for all of the vaccine components No orders of the defined types were placed in this encounter.   Caryl Pina, MD [facility] 03/06/2021, 2:19 PM

## 2021-03-07 LAB — LIPID PANEL
Chol/HDL Ratio: 3.8 ratio (ref 0.0–5.0)
Cholesterol, Total: 175 mg/dL (ref 100–199)
HDL: 46 mg/dL (ref >39)
LDL Chol Calc (NIH): 112 mg/dL — ABNORMAL HIGH (ref 0–99)
Triglycerides: 92 mg/dL (ref 0–149)
VLDL Cholesterol Cal: 17 mg/dL (ref 5–40)

## 2021-03-07 LAB — CBC WITH DIFFERENTIAL/PLATELET
Basophils Absolute: 0.1 10*3/uL (ref 0.0–0.2)
Basos: 1 %
EOS (ABSOLUTE): 0.3 10*3/uL (ref 0.0–0.4)
Eos: 3 %
Hematocrit: 45.5 % (ref 37.5–51.0)
Hemoglobin: 15.1 g/dL (ref 13.0–17.7)
Immature Grans (Abs): 0 10*3/uL (ref 0.0–0.1)
Immature Granulocytes: 0 %
Lymphocytes Absolute: 3.4 10*3/uL — ABNORMAL HIGH (ref 0.7–3.1)
Lymphs: 36 %
MCH: 31.9 pg (ref 26.6–33.0)
MCHC: 33.2 g/dL (ref 31.5–35.7)
MCV: 96 fL (ref 79–97)
Monocytes Absolute: 0.7 10*3/uL (ref 0.1–0.9)
Monocytes: 7 %
Neutrophils Absolute: 5.1 10*3/uL (ref 1.4–7.0)
Neutrophils: 53 %
Platelets: 279 10*3/uL (ref 150–450)
RBC: 4.74 x10E6/uL (ref 4.14–5.80)
RDW: 12.8 % (ref 11.6–15.4)
WBC: 9.6 10*3/uL (ref 3.4–10.8)

## 2021-03-07 LAB — CMP14+EGFR
ALT: 41 IU/L (ref 0–44)
AST: 24 IU/L (ref 0–40)
Albumin/Globulin Ratio: 2.2 (ref 1.2–2.2)
Albumin: 4.7 g/dL (ref 4.0–5.0)
Alkaline Phosphatase: 52 IU/L (ref 44–121)
BUN/Creatinine Ratio: 19 (ref 9–20)
BUN: 21 mg/dL (ref 6–24)
Bilirubin Total: 0.2 mg/dL (ref 0.0–1.2)
CO2: 25 mmol/L (ref 20–29)
Calcium: 9.4 mg/dL (ref 8.7–10.2)
Chloride: 100 mmol/L (ref 96–106)
Creatinine, Ser: 1.12 mg/dL (ref 0.76–1.27)
Globulin, Total: 2.1 g/dL (ref 1.5–4.5)
Glucose: 82 mg/dL (ref 65–99)
Potassium: 4.1 mmol/L (ref 3.5–5.2)
Sodium: 140 mmol/L (ref 134–144)
Total Protein: 6.8 g/dL (ref 6.0–8.5)
eGFR: 85 mL/min/{1.73_m2} (ref >59)

## 2021-07-03 ENCOUNTER — Ambulatory Visit: Payer: Commercial Managed Care - PPO | Admitting: Family Medicine

## 2021-07-03 ENCOUNTER — Encounter: Payer: Self-pay | Admitting: Family Medicine

## 2021-07-03 ENCOUNTER — Other Ambulatory Visit: Payer: Self-pay

## 2021-07-03 VITALS — BP 145/93 | HR 63 | Ht 67.0 in | Wt 251.0 lb

## 2021-07-03 DIAGNOSIS — S76211A Strain of adductor muscle, fascia and tendon of right thigh, initial encounter: Secondary | ICD-10-CM

## 2021-07-03 MED ORDER — METHYLPREDNISOLONE ACETATE 40 MG/ML IJ SUSP
80.0000 mg | Freq: Once | INTRAMUSCULAR | Status: AC
  Administered 2021-07-03: 80 mg via INTRAMUSCULAR

## 2021-07-03 NOTE — Progress Notes (Signed)
BP (!) 145/93   Pulse 63   Ht 5\' 7"  (1.702 m)   Wt 251 lb (113.9 kg)   SpO2 99%   BMI 39.31 kg/m    Subjective:   Patient ID: , male    DOB: 1981-02-20, 40 y.o.   MRN: [id number]  HPI: William Mack is a 40 y.o. male presenting on 07/03/2021 for Back Pain (Lower. Radiates to right hip and leg)   HPI Patient comes in right sided pain times from the right side of his hip into his groin on the right side and then down the front of his leg sometimes.  He says this all started about a week ago when he was lifting some wood at work and he started feeling tight there and then he stopped for couple days and then he started doing them again 2 days ago and felt tight again and now he is having trouble getting even up and down out of the bed or up and down out of a chair.  He says walking is fine but getting up and down is a big problem and bending up and down to try and lift things his back problem.  Hurts mostly in the right front groin area.  He has been taking Tylenol for it and does not seem to be helping.  He is also been using some icy hot and heat and those do not seem to be helping much either.  Relevant past medical, surgical, family and social history reviewed and updated as indicated. Interim medical history since our last visit reviewed. Allergies and medications reviewed and updated.  Review of Systems  Constitutional:  Negative for chills and fever.  Respiratory:  Negative for shortness of breath and wheezing.   Cardiovascular:  Negative for chest pain and leg swelling.  Musculoskeletal:  Positive for arthralgias, gait problem and myalgias. Negative for back pain.  Skin:  Negative for color change and rash.  All other systems reviewed and are negative.  Per HPI unless specifically indicated above   Allergies as of 07/03/2021   No Known Allergies      Medication List        Accurate as of July 03, 2021  8:50 AM. If you have any questions, ask  your nurse or doctor.          atorvastatin 20 MG tablet Commonly known as: LIPITOR Take 1 tablet (20 mg total) by mouth daily.   lisinopril-hydrochlorothiazide 10-12.5 MG tablet Commonly known as: ZESTORETIC Take 1 tablet by mouth daily.   meclizine 25 MG tablet Commonly known as: ANTIVERT TAKE 1 TABLET BY MOUTH THREE TIMES DAILY AS NEEDED FOR DIZZINESS   omeprazole 20 MG capsule Commonly known as: PRILOSEC Take 1 capsule (20 mg total) by mouth daily. (Please make your yearly appt)         Objective:   BP (!) 145/93   Pulse 63   Ht 5\' 7"  (1.702 m)   Wt 251 lb (113.9 kg)   SpO2 99%   BMI 39.31 kg/m   Wt Readings from Last 3 Encounters:  07/03/21 251 lb (113.9 kg)  03/06/21 263 lb (119.3 kg)  05/31/20 258 lb (117 kg)    Physical Exam Vitals and nursing note reviewed.  Constitutional:      General: He is not in acute distress.    Appearance: He is well-developed. He is not diaphoretic.  Eyes:     General: No scleral icterus.    Conjunctiva/sclera: Conjunctivae  normal.  Musculoskeletal:        General: Normal range of motion.     Lumbar back: No swelling, deformity, lacerations or spasms. Normal range of motion. Negative right straight leg raise test and negative left straight leg raise test.     Right hip: Tenderness present. No bony tenderness or crepitus. Normal range of motion. Normal strength.       Legs:  Skin:    General: Skin is warm and dry.     Findings: No rash.  Neurological:     Mental Status: He is alert and oriented to person, place, and time.     Coordination: Coordination normal.  Psychiatric:        Behavior: Behavior normal.      Assessment & Plan:   Problem List Items Addressed This Visit   None Visit Diagnoses     Strain of right groin    -  Primary   Relevant Medications   methylPREDNISolone acetate (DEPO-MEDROL) injection 80 mg (Start on 07/03/2021  9:00 AM)       Patient seems like he has right groin strain, gave a  list of exercises and will do a steroid injection.  Recommended oral over-the-counter anti-inflammatories and to do stretching.  If does not improve or worsen then we may send to physical therapy in the future. Follow up plan: Return if symptoms worsen or fail to improve.  Counseling provided for all of the vaccine components No orders of the defined types were placed in this encounter.   Arville Care, MD [facility] 07/03/2021, 8:50 AM

## 2021-07-04 ENCOUNTER — Telehealth: Payer: Self-pay | Admitting: Family Medicine

## 2021-07-04 MED ORDER — CYCLOBENZAPRINE HCL 10 MG PO TABS: 10.0000 mg | ORAL_TABLET | Freq: Three times a day (TID) | ORAL | 0 refills | Status: DC | PRN

## 2021-07-04 NOTE — Telephone Encounter (Signed)
Called in some muscle relaxers

## 2021-07-04 NOTE — Telephone Encounter (Signed)
Patient aware and verbalized understanding. °

## 2021-10-31 ENCOUNTER — Other Ambulatory Visit: Payer: Self-pay | Admitting: Family Medicine

## 2022-01-26 ENCOUNTER — Other Ambulatory Visit: Payer: Self-pay | Admitting: Family Medicine

## 2022-03-07 ENCOUNTER — Other Ambulatory Visit: Payer: Self-pay | Admitting: Family Medicine

## 2022-03-07 DIAGNOSIS — K219 Gastro-esophageal reflux disease without esophagitis: Secondary | ICD-10-CM

## 2022-03-20 ENCOUNTER — Other Ambulatory Visit: Payer: Self-pay | Admitting: Family Medicine

## 2022-03-20 DIAGNOSIS — K219 Gastro-esophageal reflux disease without esophagitis: Secondary | ICD-10-CM

## 2022-03-27 ENCOUNTER — Other Ambulatory Visit: Payer: Self-pay | Admitting: Family Medicine

## 2022-03-27 DIAGNOSIS — I10 Essential (primary) hypertension: Secondary | ICD-10-CM

## 2022-03-28 ENCOUNTER — Encounter: Payer: Self-pay | Admitting: Family Medicine

## 2022-03-28 NOTE — Telephone Encounter (Signed)
Dettinger patient. Last OV 07/03/21. Last RF 01/27/22. Next OV no upcoming OV with PCP

## 2022-03-28 NOTE — Telephone Encounter (Signed)
LMTCB To schedule appt for med f/u Letter mailed

## 2022-05-02 ENCOUNTER — Encounter: Payer: Self-pay | Admitting: Family Medicine

## 2022-05-02 ENCOUNTER — Ambulatory Visit: Payer: Commercial Managed Care - PPO | Admitting: Family Medicine

## 2022-05-02 DIAGNOSIS — K219 Gastro-esophageal reflux disease without esophagitis: Secondary | ICD-10-CM

## 2022-05-02 DIAGNOSIS — E785 Hyperlipidemia, unspecified: Secondary | ICD-10-CM | POA: Diagnosis not present

## 2022-05-02 DIAGNOSIS — I10 Essential (primary) hypertension: Secondary | ICD-10-CM

## 2022-05-02 MED ORDER — ATORVASTATIN CALCIUM 20 MG PO TABS: 20.0000 mg | ORAL_TABLET | Freq: Every day | ORAL | 1 refills | Status: DC

## 2022-05-02 MED ORDER — LISINOPRIL-HYDROCHLOROTHIAZIDE 10-12.5 MG PO TABS: 1.0000 | ORAL_TABLET | Freq: Every day | ORAL | 3 refills | Status: DC

## 2022-05-02 MED ORDER — OMEPRAZOLE 20 MG PO CPDR: 20.0000 mg | DELAYED_RELEASE_CAPSULE | Freq: Every day | ORAL | 3 refills | Status: DC

## 2022-05-02 MED ORDER — MECLIZINE HCL 25 MG PO TABS
ORAL_TABLET | ORAL | 3 refills | Status: DC
Start: 2022-05-02 — End: 2022-09-12

## 2022-05-02 MED ORDER — CYCLOBENZAPRINE HCL 10 MG PO TABS: 10.0000 mg | ORAL_TABLET | Freq: Three times a day (TID) | ORAL | 3 refills | Status: AC | PRN

## 2022-05-02 NOTE — Progress Notes (Signed)
BP 132/90   Pulse 71   Temp 98 F (36.7 C)   Ht '5\' 7"'  (1.702 m)   Wt 250 lb (113.4 kg)   SpO2 97%   BMI 39.16 kg/m    Subjective:   Patient ID: William Mack, male    DOB: [birth year], 41 y.o.   MRN: [id number]  HPI: William Mack is a 41 y.o. male presenting on 05/02/2022 for Medical Management of Chronic Issues, Hypertension, Hyperlipidemia, and Gastroesophageal Reflux   HPI Hypertension Patient is currently on lisinopril hydrochlorothiazide, and their blood pressure today is 132/90. Patient denies any lightheadedness or dizziness. Patient denies headaches, blurred vision, chest pains, shortness of breath, or weakness. Denies any side effects from medication and is content with current medication.   Hyperlipidemia Patient is coming in for recheck of his hyperlipidemia. The patient is currently taking atorvastatin. They deny any issues with myalgias or history of liver damage from it. They deny any focal numbness or weakness or chest pain.   GERD Patient is currently on omeprazole.  She denies any major symptoms or abdominal pain or belching or burping. She denies any blood in her stool or lightheadedness or dizziness.   Relevant past medical, surgical, family and social history reviewed and updated as indicated. Interim medical history since our last visit reviewed. Allergies and medications reviewed and updated.  Review of Systems  Constitutional:  Negative for chills and fever.  Eyes:  Negative for visual disturbance.  Respiratory:  Negative for shortness of breath and wheezing.   Cardiovascular:  Negative for chest pain and leg swelling.  Musculoskeletal:  Negative for back pain and gait problem.  Skin:  Negative for rash.  Neurological:  Negative for dizziness, weakness and light-headedness.  All other systems reviewed and are negative.   Per HPI unless specifically indicated above   Allergies as of 05/02/2022   No Known Allergies      Medication List         Accurate as of May 02, 2022  4:23 PM. If you have any questions, ask your nurse or doctor.          atorvastatin 20 MG tablet Commonly known as: LIPITOR Take 1 tablet (20 mg total) by mouth daily.   cyclobenzaprine 10 MG tablet Commonly known as: FLEXERIL Take 1 tablet (10 mg total) by mouth 3 (three) times daily as needed for muscle spasms.   lisinopril-hydrochlorothiazide 10-12.5 MG tablet Commonly known as: ZESTORETIC Take 1 tablet by mouth daily. Needs office visit for further refills   meclizine 25 MG tablet Commonly known as: ANTIVERT Take one tablet three times daily as needed for dizziness What changed: See the new instructions. Changed by: Fransisca Kaufmann Kaisen Ackers, MD   omeprazole 20 MG capsule Commonly known as: PRILOSEC Take 1 capsule (20 mg total) by mouth daily. (Please make your yearly appt)         Objective:   BP 132/90   Pulse 71   Temp 98 F (36.7 C)   Ht '5\' 7"'  (1.702 m)   Wt 250 lb (113.4 kg)   SpO2 97%   BMI 39.16 kg/m   Wt Readings from Last 3 Encounters:  05/02/22 250 lb (113.4 kg)  07/03/21 251 lb (113.9 kg)  03/06/21 263 lb (119.3 kg)    Physical Exam Vitals and nursing note reviewed.  Constitutional:      General: He is not in acute distress.    Appearance: He is well-developed. He is not diaphoretic.  Eyes:  General: No scleral icterus.    Conjunctiva/sclera: Conjunctivae normal.  Neck:     Thyroid: No thyromegaly.  Cardiovascular:     Rate and Rhythm: Normal rate and regular rhythm.     Heart sounds: Normal heart sounds. No murmur heard. Pulmonary:     Effort: Pulmonary effort is normal. No respiratory distress.     Breath sounds: Normal breath sounds. No wheezing.  Musculoskeletal:        General: Normal range of motion.     Cervical back: Neck supple.  Lymphadenopathy:     Cervical: No cervical adenopathy.  Skin:    General: Skin is warm and dry.     Findings: No rash.  Neurological:     Mental Status: He  is alert and oriented to person, place, and time.     Coordination: Coordination normal.  Psychiatric:        Behavior: Behavior normal.       Assessment & Plan:   Problem List Items Addressed This Visit       Cardiovascular and Mediastinum   Essential hypertension   Relevant Medications   atorvastatin (LIPITOR) 20 MG tablet   lisinopril-hydrochlorothiazide (ZESTORETIC) 10-12.5 MG tablet   Other Relevant Orders   CBC with Differential/Platelet   CMP14+EGFR   Lipid panel     Digestive   GERD (gastroesophageal reflux disease)   Relevant Medications   meclizine (ANTIVERT) 25 MG tablet   omeprazole (PRILOSEC) 20 MG capsule   Other Relevant Orders   CBC with Differential/Platelet   CMP14+EGFR   Lipid panel     Other   Hyperlipidemia LDL goal <130   Relevant Medications   atorvastatin (LIPITOR) 20 MG tablet   lisinopril-hydrochlorothiazide (ZESTORETIC) 10-12.5 MG tablet   Other Relevant Orders   CBC with Differential/Platelet   CMP14+EGFR   Lipid panel    Continue current medicine, no changes.  We will check blood work. Follow up plan: Return in about 6 months (around 11/02/2022), or if symptoms worsen or fail to improve, for Physical exam.  Counseling provided for all of the vaccine components Orders Placed This Encounter  Procedures   CBC with Differential/Platelet   CMP14+EGFR   Lipid panel    Caryl Pina, MD [facility] 05/02/2022, 4:23 PM

## 2022-05-03 LAB — CMP14+EGFR
ALT: 30 IU/L (ref 0–44)
AST: 19 IU/L (ref 0–40)
Albumin/Globulin Ratio: 2.1 (ref 1.2–2.2)
Albumin: 4.7 g/dL (ref 4.1–5.1)
Alkaline Phosphatase: 45 IU/L (ref 44–121)
BUN/Creatinine Ratio: 18 (ref 9–20)
BUN: 24 mg/dL (ref 6–24)
Bilirubin Total: 0.5 mg/dL (ref 0.0–1.2)
CO2: 25 mmol/L (ref 20–29)
Calcium: 9.2 mg/dL (ref 8.7–10.2)
Chloride: 104 mmol/L (ref 96–106)
Creatinine, Ser: 1.31 mg/dL — ABNORMAL HIGH (ref 0.76–1.27)
Globulin, Total: 2.2 g/dL (ref 1.5–4.5)
Glucose: 83 mg/dL (ref 70–99)
Potassium: 4.2 mmol/L (ref 3.5–5.2)
Sodium: 144 mmol/L (ref 134–144)
Total Protein: 6.9 g/dL (ref 6.0–8.5)
eGFR: 70 mL/min/{1.73_m2} (ref >59)

## 2022-05-03 LAB — CBC WITH DIFFERENTIAL/PLATELET
Basophils Absolute: 0.1 10*3/uL (ref 0.0–0.2)
Basos: 1 %
EOS (ABSOLUTE): 0.1 10*3/uL (ref 0.0–0.4)
Eos: 1 %
Hematocrit: 44.6 % (ref 37.5–51.0)
Hemoglobin: 14.8 g/dL (ref 13.0–17.7)
Immature Grans (Abs): 0 10*3/uL (ref 0.0–0.1)
Immature Granulocytes: 0 %
Lymphocytes Absolute: 3.4 10*3/uL — ABNORMAL HIGH (ref 0.7–3.1)
Lymphs: 32 %
MCH: 31.6 pg (ref 26.6–33.0)
MCHC: 33.2 g/dL (ref 31.5–35.7)
MCV: 95 fL (ref 79–97)
Monocytes Absolute: 0.7 10*3/uL (ref 0.1–0.9)
Monocytes: 6 %
Neutrophils Absolute: 6.4 10*3/uL (ref 1.4–7.0)
Neutrophils: 60 %
Platelets: 290 10*3/uL (ref 150–450)
RBC: 4.68 x10E6/uL (ref 4.14–5.80)
RDW: 12.9 % (ref 11.6–15.4)
WBC: 10.6 10*3/uL (ref 3.4–10.8)

## 2022-05-03 LAB — LIPID PANEL
Chol/HDL Ratio: 4.2 ratio (ref 0.0–5.0)
Cholesterol, Total: 176 mg/dL (ref 100–199)
HDL: 42 mg/dL (ref >39)
LDL Chol Calc (NIH): 119 mg/dL — ABNORMAL HIGH (ref 0–99)
Triglycerides: 82 mg/dL (ref 0–149)
VLDL Cholesterol Cal: 15 mg/dL (ref 5–40)

## 2022-09-11 ENCOUNTER — Other Ambulatory Visit: Payer: Self-pay | Admitting: Family Medicine

## 2022-09-11 NOTE — Telephone Encounter (Signed)
Last OV 05/02/22. Last RF 05/02/22. Next OV 11/03/22

## 2022-09-23 ENCOUNTER — Encounter: Payer: Self-pay | Admitting: Family Medicine

## 2022-09-23 ENCOUNTER — Ambulatory Visit: Payer: Commercial Managed Care - PPO | Admitting: Family Medicine

## 2022-09-23 VITALS — BP 134/81 | HR 72 | Temp 98.2°F | Ht 67.0 in | Wt 252.6 lb

## 2022-09-23 DIAGNOSIS — R300 Dysuria: Secondary | ICD-10-CM

## 2022-09-23 LAB — URINALYSIS, ROUTINE W REFLEX MICROSCOPIC
Bilirubin, UA: NEGATIVE
Glucose, UA: NEGATIVE
Ketones, UA: NEGATIVE
Leukocytes,UA: NEGATIVE
Nitrite, UA: NEGATIVE
Protein,UA: NEGATIVE
RBC, UA: NEGATIVE
Specific Gravity, UA: 1.02 (ref 1.005–1.030)
Urobilinogen, Ur: 0.2 mg/dL (ref 0.2–1.0)
pH, UA: 7 (ref 5.0–7.5)

## 2022-09-23 NOTE — Progress Notes (Signed)
Subjective:  Patient ID: William Mack, male    DOB: 09/13/1981, 41 y.o.   MRN: [id number]  Patient Care Team: Dettinger, Fransisca Kaufmann, MD as PCP - General (Family Medicine)   Chief Complaint:  Dysuria and Flank Pain (Left flank x 10 days on and off )   HPI: William Mack is a 41 y.o. male presenting on 09/23/2022 for Dysuria and Flank Pain (Left flank x 10 days on and off )   Pt reports dysuria and flank pain on Sunday. States this has since resolved. No other associated symptoms. Does have a history of renal stones.   Dysuria  This is a new problem. Episode onset: Sunday. The problem has been resolved. The quality of the pain is described as burning and shooting. The pain is mild. There has been no fever. He is Not sexually active. There is No history of pyelonephritis. Associated symptoms include flank pain. Pertinent negatives include no chills, discharge, frequency, hematuria, hesitancy, nausea, sweats, urgency or vomiting. He has tried nothing for the symptoms. His past medical history is significant for kidney stones.  Flank Pain The problem has been resolved since onset. The quality of the pain is described as shooting and stabbing. The pain is mild. Associated symptoms include dysuria. Pertinent negatives include no abdominal pain, bladder incontinence, bowel incontinence, chest pain, fever, headaches, leg pain, numbness, paresis, paresthesias, pelvic pain, perianal numbness, tingling, weakness or weight loss. He has tried nothing for the symptoms.    Relevant past medical, surgical, family, and social history reviewed and updated as indicated.  Allergies and medications reviewed and updated. Data reviewed: Chart in Epic.   Past Medical History:  Diagnosis Date   Dizziness and giddiness 11/01/2015   GERD (gastroesophageal reflux disease)    Hypertension    Kidney stones 06/01/2015    Past Surgical History:  Procedure Laterality Date   None      Social History    Socioeconomic History   Marital status: Married    Spouse name: Not on file   Number of children: 2   Years of education: 9   Highest education level: Not on file  Occupational History   Not on file  Tobacco Use   Smoking status: Never   Smokeless tobacco: Never  Substance and Sexual Activity   Alcohol use: No    Alcohol/week: 0.0 standard drinks of alcohol   Drug use: No   Sexual activity: Not on file  Other Topics Concern   Not on file  Social History Narrative   Patient no longer drinks caffeine.   Social Determinants of Health   Financial Resource Strain: Not on file  Food Insecurity: Not on file  Transportation Needs: Not on file  Physical Activity: Not on file  Stress: Not on file  Social Connections: Not on file  Intimate Partner Violence: Not on file    Outpatient Encounter Medications as of 09/23/2022  Medication Sig   atorvastatin (LIPITOR) 20 MG tablet Take 1 tablet (20 mg total) by mouth daily.   cyclobenzaprine (FLEXERIL) 10 MG tablet Take 1 tablet (10 mg total) by mouth 3 (three) times daily as needed for muscle spasms.   lisinopril-hydrochlorothiazide (ZESTORETIC) 10-12.5 MG tablet Take 1 tablet by mouth daily. Needs office visit for further refills   meclizine (ANTIVERT) 25 MG tablet TAKE 1 TABLET BY MOUTH THREE TIMES DAILY AS NEEDED FOR DIZZINESS   omeprazole (PRILOSEC) 20 MG capsule Take 1 capsule (20 mg total) by mouth daily. (Please  make your yearly appt)   No facility-administered encounter medications on file as of 09/23/2022.    No Known Allergies  Review of Systems  Constitutional:  Negative for activity change, appetite change, chills, diaphoresis, fatigue, fever, unexpected weight change and weight loss.  HENT: Negative.    Eyes: Negative.   Respiratory:  Negative for cough, chest tightness and shortness of breath.   Cardiovascular:  Negative for chest pain, palpitations and leg swelling.  Gastrointestinal:  Negative for abdominal  distention, abdominal pain, anal bleeding, blood in stool, bowel incontinence, constipation, diarrhea, nausea, rectal pain and vomiting.  Endocrine: Negative.   Genitourinary:  Positive for dysuria and flank pain. Negative for bladder incontinence, decreased urine volume, difficulty urinating, enuresis, frequency, genital sores, hematuria, hesitancy, pelvic pain, penile discharge, penile pain, scrotal swelling, testicular pain and urgency.  Musculoskeletal:  Negative for arthralgias and myalgias.  Skin: Negative.   Allergic/Immunologic: Negative.   Neurological:  Negative for dizziness, tingling, tremors, seizures, syncope, facial asymmetry, speech difficulty, weakness, light-headedness, numbness, headaches and paresthesias.  Hematological: Negative.   Psychiatric/Behavioral:  Negative for confusion, hallucinations, sleep disturbance and suicidal ideas.   All other systems reviewed and are negative.       Objective:  BP 134/81   Pulse 72   Temp 98.2 F (36.8 C) (Temporal)   Ht _0  (1.702 m)   Wt 252 lb 9.6 oz (114.6 kg)   SpO2 98%   BMI 39.56 kg/m    Wt Readings from Last 3 Encounters:  09/23/22 252 lb 9.6 oz (114.6 kg)  05/02/22 250 lb (113.4 kg)  07/03/21 251 lb (113.9 kg)    Physical Exam Vitals and nursing note reviewed.  Constitutional:      General: He is not in acute distress.    Appearance: Normal appearance. He is well-developed and well-groomed. He is obese. He is not ill-appearing, toxic-appearing or diaphoretic.  HENT:     Head: Normocephalic and atraumatic.     Jaw: There is normal jaw occlusion.     Right Ear: Hearing normal.     Left Ear: Hearing normal.     Nose: Nose normal.     Mouth/Throat:     Lips: Pink.     Mouth: Mucous membranes are moist.     Pharynx: Oropharynx is clear. Uvula midline.  Eyes:     General: Lids are normal.     Extraocular Movements: Extraocular movements intact.     Conjunctiva/sclera: Conjunctivae normal.     Pupils:  Pupils are equal, round, and reactive to light.  Neck:     Thyroid: No thyroid mass, thyromegaly or thyroid tenderness.     Vascular: No carotid bruit or JVD.     Trachea: Trachea and phonation normal.  Cardiovascular:     Rate and Rhythm: Normal rate and regular rhythm.     Chest Wall: PMI is not displaced.     Pulses: Normal pulses.     Heart sounds: Normal heart sounds. No murmur heard.    No friction rub. No gallop.  Pulmonary:     Effort: Pulmonary effort is normal. No respiratory distress.     Breath sounds: Normal breath sounds. No wheezing.  Abdominal:     General: Bowel sounds are normal. There is no distension or abdominal bruit.     Palpations: Abdomen is soft. There is no hepatomegaly, splenomegaly or mass.     Tenderness: There is no abdominal tenderness. There is no right CVA tenderness, left CVA tenderness, guarding or rebound.  Hernia: No hernia is present.  Musculoskeletal:        General: Normal range of motion.     Cervical back: Normal range of motion and neck supple.     Right lower leg: No edema.     Left lower leg: No edema.  Lymphadenopathy:     Cervical: No cervical adenopathy.  Skin:    General: Skin is warm and dry.     Capillary Refill: Capillary refill takes less than 2 seconds.     Coloration: Skin is not cyanotic, jaundiced or pale.     Findings: No rash.  Neurological:     General: No focal deficit present.     Mental Status: He is alert and oriented to person, place, and time.     Sensory: Sensation is intact.     Motor: Motor function is intact.     Coordination: Coordination is intact.     Gait: Gait is intact.     Deep Tendon Reflexes: Reflexes are normal and symmetric.  Psychiatric:        Attention and Perception: Attention and perception normal.        Mood and Affect: Mood and affect normal.        Speech: Speech normal.        Behavior: Behavior normal. Behavior is cooperative.        Thought Content: Thought content normal.         Cognition and Memory: Cognition and memory normal.        Judgment: Judgment normal.     Results for orders placed or performed in visit on 05/02/22  CBC with Differential/Platelet  Result Value Ref Range   WBC 10.6 3.4 - 10.8 x10E3/uL   RBC 4.68 4.14 - 5.80 x10E6/uL   Hemoglobin 14.8 13.0 - 17.7 g/dL   Hematocrit 44.6 37.5 - 51.0 %   MCV 95 79 - 97 fL   MCH 31.6 26.6 - 33.0 pg   MCHC 33.2 31.5 - 35.7 g/dL   RDW 12.9 11.6 - 15.4 %   Platelets 290 150 - 450 x10E3/uL   Neutrophils 60 Not Estab. %   Lymphs 32 Not Estab. %   Monocytes 6 Not Estab. %   Eos 1 Not Estab. %   Basos 1 Not Estab. %   Neutrophils Absolute 6.4 1.4 - 7.0 x10E3/uL   Lymphocytes Absolute 3.4 (H) 0.7 - 3.1 x10E3/uL   Monocytes Absolute 0.7 0.1 - 0.9 x10E3/uL   EOS (ABSOLUTE) 0.1 0.0 - 0.4 x10E3/uL   Basophils Absolute 0.1 0.0 - 0.2 x10E3/uL   Immature Granulocytes 0 Not Estab. %   Immature Grans (Abs) 0.0 0.0 - 0.1 x10E3/uL  CMP14+EGFR  Result Value Ref Range   Glucose 83 70 - 99 mg/dL   BUN 24 6 - 24 mg/dL   Creatinine, Ser 1.31 (H) 0.76 - 1.27 mg/dL   eGFR 70 >59 mL/min/1.73   BUN/Creatinine Ratio 18 9 - 20   Sodium 144 134 - 144 mmol/L   Potassium 4.2 3.5 - 5.2 mmol/L   Chloride 104 96 - 106 mmol/L   CO2 25 20 - 29 mmol/L   Calcium 9.2 8.7 - 10.2 mg/dL   Total Protein 6.9 6.0 - 8.5 g/dL   Albumin 4.7 4.1 - 5.1 g/dL   Globulin, Total 2.2 1.5 - 4.5 g/dL   Albumin/Globulin Ratio 2.1 1.2 - 2.2   Bilirubin Total 0.5 0.0 - 1.2 mg/dL   Alkaline Phosphatase 45 44 - 121 IU/L  AST 19 0 - 40 IU/L   ALT 30 0 - 44 IU/L  Lipid panel  Result Value Ref Range   Cholesterol, Total 176 100 - 199 mg/dL   Triglycerides 82 0 - 149 mg/dL   HDL 42 >39 mg/dL   VLDL Cholesterol Cal 15 5 - 40 mg/dL   LDL Chol Calc (NIH) 119 (H) 0 - 99 mg/dL   Chol/HDL Ratio 4.2 0.0 - 5.0 ratio       Pertinent labs & imaging results that were available during my care of the patient were reviewed by me and considered in my  medical decision making.  Assessment & Plan:  William Mack was seen today for dysuria and flank pain.  Diagnoses and all orders for this visit:  Dysuria Urinalysis unremarkable in office. Culture pending and will treat if warranted. No indications of acute cystitis or pyelonephritis. Avoid bladder irritants such as caffeine. Increase water intake. Report new, worsening, or persistent symptoms.  -     Urinalysis, Routine w reflex microscopic -     Urine Culture     Continue all other maintenance medications.  Follow up plan: Return if symptoms worsen or fail to improve.    The above assessment and management plan was discussed with the patient. The patient verbalized understanding of and has agreed to the management plan. Patient is aware to call the clinic if they develop any new symptoms or if symptoms persist or worsen. Patient is aware when to return to the clinic for a follow-up visit. Patient educated on when it is appropriate to go to the emergency department.   Monia Pouch, FNP-C [facility] [phone]

## 2022-09-24 LAB — URINE CULTURE

## 2022-10-16 ENCOUNTER — Other Ambulatory Visit: Payer: Self-pay | Admitting: Family Medicine

## 2022-10-25 ENCOUNTER — Other Ambulatory Visit: Payer: Self-pay | Admitting: Family Medicine

## 2022-10-25 DIAGNOSIS — E785 Hyperlipidemia, unspecified: Secondary | ICD-10-CM

## 2022-11-03 ENCOUNTER — Ambulatory Visit (INDEPENDENT_AMBULATORY_CARE_PROVIDER_SITE_OTHER): Payer: Commercial Managed Care - PPO | Admitting: Family Medicine

## 2022-11-03 ENCOUNTER — Encounter: Payer: Self-pay | Admitting: Family Medicine

## 2022-11-03 VITALS — BP 139/88 | HR 88 | Temp 98.0°F | Ht 67.0 in | Wt 252.0 lb

## 2022-11-03 DIAGNOSIS — I10 Essential (primary) hypertension: Secondary | ICD-10-CM

## 2022-11-03 DIAGNOSIS — Z23 Encounter for immunization: Secondary | ICD-10-CM

## 2022-11-03 DIAGNOSIS — E785 Hyperlipidemia, unspecified: Secondary | ICD-10-CM

## 2022-11-03 DIAGNOSIS — Z0001 Encounter for general adult medical examination with abnormal findings: Secondary | ICD-10-CM | POA: Diagnosis not present

## 2022-11-03 DIAGNOSIS — Z0000 Encounter for general adult medical examination without abnormal findings: Secondary | ICD-10-CM

## 2022-11-03 DIAGNOSIS — K219 Gastro-esophageal reflux disease without esophagitis: Secondary | ICD-10-CM

## 2022-11-03 MED ORDER — OMEPRAZOLE 20 MG PO CPDR: 20.0000 mg | DELAYED_RELEASE_CAPSULE | Freq: Every day | ORAL | 3 refills | Status: DC

## 2022-11-03 MED ORDER — ATORVASTATIN CALCIUM 20 MG PO TABS: 20.0000 mg | ORAL_TABLET | Freq: Every day | ORAL | 3 refills | Status: DC

## 2022-11-03 MED ORDER — LISINOPRIL-HYDROCHLOROTHIAZIDE 10-12.5 MG PO TABS: 1.0000 | ORAL_TABLET | Freq: Every day | ORAL | 3 refills | Status: DC

## 2022-11-03 NOTE — Progress Notes (Signed)
BP 139/88   Pulse 88   Temp 98 F (36.7 C)   Ht 5\' 7"  (1.702 m)   Wt 252 lb (114.3 kg)   SpO2 99%   BMI 39.47 kg/m    Subjective:   Patient ID: William Mack, male    DOB: 1980/11/03, 42 y.o.   MRN: [id number]  HPI: William Mack is a 42 y.o. male presenting on 11/03/2022 for Medical Management of Chronic Issues (CPE)   HPI Physical exam Patient denies any chest pain, shortness of breath, headaches or vision issues, abdominal complaints, diarrhea, nausea, vomiting, or joint issues.   Hypertension Patient is currently on lisinopril hydrochlorothiazide, and their blood pressure today is 139/86. Patient denies any lightheadedness or dizziness. Patient denies headaches, blurred vision, chest pains, shortness of breath, or weakness. Denies any side effects from medication and is content with current medication.   Hyperlipidemia Patient is coming in for recheck of his hyperlipidemia. The patient is currently taking atorvastatin. They deny any issues with myalgias or history of liver damage from it. They deny any focal numbness or weakness or chest pain.   GERD Patient is currently on omeprazole.  She denies any major symptoms or abdominal pain or belching or burping. She denies any blood in her stool or lightheadedness or dizziness.   Relevant past medical, surgical, family and social history reviewed and updated as indicated. Interim medical history since our last visit reviewed. Allergies and medications reviewed and updated.  Review of Systems  Constitutional:  Negative for chills and fever.  HENT:  Negative for ear pain and tinnitus.   Eyes:  Negative for pain.  Respiratory:  Negative for cough, shortness of breath and wheezing.   Cardiovascular:  Negative for chest pain, palpitations and leg swelling.  Gastrointestinal:  Negative for abdominal pain, blood in stool, constipation and diarrhea.  Genitourinary:  Negative for dysuria and hematuria.  Musculoskeletal:   Negative for back pain and myalgias.  Skin:  Negative for rash.  Neurological:  Negative for dizziness, weakness and headaches.  Psychiatric/Behavioral:  Negative for suicidal ideas.     Per HPI unless specifically indicated above   Allergies as of 11/03/2022   No Known Allergies      Medication List        Accurate as of November 03, 2022  4:31 PM. If you have any questions, ask your nurse or doctor.          atorvastatin 20 MG tablet Commonly known as: LIPITOR Take 1 tablet (20 mg total) by mouth daily.   cyclobenzaprine 10 MG tablet Commonly known as: FLEXERIL Take 1 tablet (10 mg total) by mouth 3 (three) times daily as needed for muscle spasms.   lisinopril-hydrochlorothiazide 10-12.5 MG tablet Commonly known as: ZESTORETIC Take 1 tablet by mouth daily. What changed: additional instructions Changed by: [name], MD   meclizine 25 MG tablet Commonly known as: ANTIVERT TAKE 1 TABLET BY MOUTH THREE TIMES DAILY AS NEEDED FOR DIZZINESS   omeprazole 20 MG capsule Commonly known as: PRILOSEC Take 1 capsule (20 mg total) by mouth daily. What changed: additional instructions Changed by: [name], MD         Objective:   BP 139/88   Pulse 88   Temp 98 F (36.7 C)   Ht 5\' 7"  (1.702 m)   Wt 252 lb (114.3 kg)   SpO2 99%   BMI 39.47 kg/m   Wt Readings from Last 3 Encounters:  11/03/22 252 lb (  114.3 kg)  09/23/22 252 lb 9.6 oz (114.6 kg)  05/02/22 250 lb (113.4 kg)    Physical Exam Vitals reviewed.  Constitutional:      General: He is not in acute distress.    Appearance: He is well-developed. He is not diaphoretic.  HENT:     Right Ear: External ear normal.     Left Ear: External ear normal.     Nose: Nose normal.     Mouth/Throat:     Pharynx: No oropharyngeal exudate.  Eyes:     General: No scleral icterus.    Conjunctiva/sclera: Conjunctivae normal.  Neck:     Thyroid: No thyromegaly.  Cardiovascular:     Rate and  Rhythm: Normal rate and regular rhythm.     Heart sounds: Normal heart sounds. No murmur heard. Pulmonary:     Effort: Pulmonary effort is normal. No respiratory distress.     Breath sounds: Normal breath sounds. No wheezing.  Abdominal:     General: Bowel sounds are normal. There is no distension.     Palpations: Abdomen is soft.     Tenderness: There is no abdominal tenderness. There is no guarding or rebound.  Musculoskeletal:        General: No swelling. Normal range of motion.     Cervical back: Neck supple.  Lymphadenopathy:     Cervical: No cervical adenopathy.  Skin:    General: Skin is warm and dry.     Findings: No rash.  Neurological:     Mental Status: He is alert and oriented to person, place, and time.     Coordination: Coordination normal.  Psychiatric:        Behavior: Behavior normal.       Assessment & Plan:   Problem List Items Addressed This Visit       Cardiovascular and Mediastinum   Essential hypertension   Relevant Medications   atorvastatin (LIPITOR) 20 MG tablet   lisinopril-hydrochlorothiazide (ZESTORETIC) 10-12.5 MG tablet   Other Relevant Orders   CBC with Differential/Platelet   CMP14+EGFR   Lipid panel     Digestive   GERD (gastroesophageal reflux disease)   Relevant Medications   omeprazole (PRILOSEC) 20 MG capsule   Other Relevant Orders   CBC with Differential/Platelet   CMP14+EGFR   Lipid panel     Other   Hyperlipidemia LDL goal <130   Relevant Medications   atorvastatin (LIPITOR) 20 MG tablet   lisinopril-hydrochlorothiazide (ZESTORETIC) 10-12.5 MG tablet   Other Relevant Orders   CBC with Differential/Platelet   CMP14+EGFR   Lipid panel   Other Visit Diagnoses     Physical exam    -  Primary   Relevant Orders   CBC with Differential/Platelet   CMP14+EGFR   Lipid panel   Need for Tdap vaccination       Relevant Orders   Tdap vaccine greater than or equal to 7yo IM (Completed)       Continue current  medicine, seems to be doing well, will check blood work. Follow up plan: Return in about 1 year (around 11/04/2023), or if symptoms worsen or fail to improve, for Physical exam.  Counseling provided for all of the vaccine components Orders Placed This Encounter  Procedures   Tdap vaccine greater than or equal to 7yo IM   CBC with Differential/Platelet   CMP14+EGFR   Lipid panel    Caryl Pina, MD [facility] 11/03/2022, 4:31 PM

## 2022-11-04 LAB — CBC WITH DIFFERENTIAL/PLATELET
Basophils Absolute: 0.1 10*3/uL (ref 0.0–0.2)
Basos: 1 %
EOS (ABSOLUTE): 0.2 10*3/uL (ref 0.0–0.4)
Eos: 1 %
Hematocrit: 45.7 % (ref 37.5–51.0)
Hemoglobin: 15.5 g/dL (ref 13.0–17.7)
Immature Grans (Abs): 0 10*3/uL (ref 0.0–0.1)
Immature Granulocytes: 0 %
Lymphocytes Absolute: 4.1 10*3/uL — ABNORMAL HIGH (ref 0.7–3.1)
Lymphs: 35 %
MCH: 32.2 pg (ref 26.6–33.0)
MCHC: 33.9 g/dL (ref 31.5–35.7)
MCV: 95 fL (ref 79–97)
Monocytes Absolute: 0.7 10*3/uL (ref 0.1–0.9)
Monocytes: 6 %
Neutrophils Absolute: 6.7 10*3/uL (ref 1.4–7.0)
Neutrophils: 57 %
Platelets: 293 10*3/uL (ref 150–450)
RBC: 4.81 x10E6/uL (ref 4.14–5.80)
RDW: 12.3 % (ref 11.6–15.4)
WBC: 11.7 10*3/uL — ABNORMAL HIGH (ref 3.4–10.8)

## 2022-11-04 LAB — CMP14+EGFR
ALT: 26 IU/L (ref 0–44)
AST: 17 IU/L (ref 0–40)
Albumin/Globulin Ratio: 1.8 (ref 1.2–2.2)
Albumin: 4.6 g/dL (ref 4.1–5.1)
Alkaline Phosphatase: 50 IU/L (ref 44–121)
BUN/Creatinine Ratio: 20 (ref 9–20)
BUN: 22 mg/dL (ref 6–24)
Bilirubin Total: 0.3 mg/dL (ref 0.0–1.2)
CO2: 23 mmol/L (ref 20–29)
Calcium: 9.5 mg/dL (ref 8.7–10.2)
Chloride: 102 mmol/L (ref 96–106)
Creatinine, Ser: 1.08 mg/dL (ref 0.76–1.27)
Globulin, Total: 2.6 g/dL (ref 1.5–4.5)
Glucose: 74 mg/dL (ref 70–99)
Potassium: 3.9 mmol/L (ref 3.5–5.2)
Sodium: 142 mmol/L (ref 134–144)
Total Protein: 7.2 g/dL (ref 6.0–8.5)
eGFR: 88 mL/min/{1.73_m2} (ref >59)

## 2022-11-04 LAB — LIPID PANEL
Chol/HDL Ratio: 4 ratio (ref 0.0–5.0)
Cholesterol, Total: 172 mg/dL (ref 100–199)
HDL: 43 mg/dL (ref >39)
LDL Chol Calc (NIH): 113 mg/dL — ABNORMAL HIGH (ref 0–99)
Triglycerides: 84 mg/dL (ref 0–149)
VLDL Cholesterol Cal: 16 mg/dL (ref 5–40)

## 2022-11-10 ENCOUNTER — Other Ambulatory Visit: Payer: Self-pay

## 2022-11-10 MED ORDER — ROSUVASTATIN CALCIUM 5 MG PO TABS: 5.0000 mg | ORAL_TABLET | Freq: Every day | ORAL | 3 refills | Status: DC

## 2023-02-12 ENCOUNTER — Other Ambulatory Visit: Payer: Self-pay | Admitting: Family Medicine

## 2023-05-25 ENCOUNTER — Ambulatory Visit: Payer: Commercial Managed Care - PPO | Admitting: Family Medicine

## 2023-05-25 ENCOUNTER — Encounter: Payer: Self-pay | Admitting: Family Medicine

## 2023-05-25 VITALS — BP 139/84 | HR 67 | Temp 98.2°F | Ht 67.0 in | Wt 256.0 lb

## 2023-05-25 DIAGNOSIS — U071 COVID-19: Secondary | ICD-10-CM

## 2023-05-25 MED ORDER — NIRMATRELVIR/RITONAVIR (PAXLOVID)TABLET
3.0000 | ORAL_TABLET | Freq: Two times a day (BID) | ORAL | 0 refills | Status: AC
Start: 2023-05-25 — End: 2023-05-30

## 2023-05-25 MED ORDER — CHLORPHEN-PE-ACETAMINOPHEN 4-10-325 MG PO TABS
1.0000 | ORAL_TABLET | Freq: Four times a day (QID) | ORAL | 0 refills | Status: DC | PRN
Start: 2023-05-25 — End: 2023-12-04

## 2023-05-25 NOTE — Progress Notes (Signed)
Acute Office Visit  Subjective:     Patient ID: William Mack, male    DOB: 1981-01-11, 42 y.o.   MRN: [id number]  Chief Complaint  Patient presents with   Covid Positive    URI  This is a new problem. The current episode started yesterday. The problem has been unchanged. There has been no fever. Associated symptoms include congestion, coughing and sneezing. Pertinent negatives include no abdominal pain, chest pain, diarrhea, dysuria, ear pain, headaches, joint pain, joint swelling, nausea, neck pain, plugged ear sensation, rash, rhinorrhea, sinus pain, sore throat, swollen glands, vomiting or wheezing. Treatments tried: mucinex. The treatment provided no relief.   Tested positive for Covid with a home test this morning.   Review of Systems  HENT:  Positive for congestion and sneezing. Negative for ear pain, rhinorrhea, sinus pain and sore throat.   Respiratory:  Positive for cough. Negative for wheezing.   Cardiovascular:  Negative for chest pain.  Gastrointestinal:  Negative for abdominal pain, diarrhea, nausea and vomiting.  Genitourinary:  Negative for dysuria.  Musculoskeletal:  Negative for joint pain and neck pain.  Skin:  Negative for rash.  Neurological:  Negative for headaches.        Objective:    BP 139/84   Pulse 67   Temp 98.2 F (36.8 C) (Temporal)   Ht 5\' 7"  (1.702 m)   Wt 256 lb (116.1 kg)   SpO2 98%   BMI 40.10 kg/m    Physical Exam Vitals and nursing note reviewed.  Constitutional:      General: He is not in acute distress.    Appearance: Normal appearance. He is not ill-appearing, toxic-appearing or diaphoretic.  HENT:     Head: Normocephalic and atraumatic.     Right Ear: Tympanic membrane, ear canal and external ear normal.     Left Ear: Tympanic membrane, ear canal and external ear normal.     Nose: Congestion present.     Mouth/Throat:     Mouth: Mucous membranes are moist.     Pharynx: Oropharynx is clear.  Eyes:     General:         Right eye: No discharge.        Left eye: No discharge.     Conjunctiva/sclera: Conjunctivae normal.  Cardiovascular:     Rate and Rhythm: Normal rate and regular rhythm.     Heart sounds: Normal heart sounds. No murmur heard. Pulmonary:     Effort: Pulmonary effort is normal. No respiratory distress.     Breath sounds: Normal breath sounds.  Abdominal:     General: There is no distension.     Palpations: Abdomen is soft.     Tenderness: There is no abdominal tenderness.  Musculoskeletal:     Cervical back: Neck supple.     Right lower leg: No edema.     Left lower leg: No edema.  Lymphadenopathy:     Cervical: No cervical adenopathy.  Skin:    General: Skin is warm and dry.  Neurological:     General: No focal deficit present.     Mental Status: He is alert and oriented to person, place, and time.  Psychiatric:        Mood and Affect: Mood normal.        Behavior: Behavior normal.     No results found for any visits on 05/25/23.      Assessment & Plan:   William Mack was seen today for covid positive.  Diagnoses and all orders for this visit:  COVID-19 With Hx of obesity, HTN, HLD. Discussed and ordered paxlovid. Discussed symptomatic care and return precautions. Discussed quarantine. Work note provided.  -     Chlorphen-PE-Acetaminophen 4-10-325 MG TABS; Take 1 tablet by mouth every 6 (six) hours as needed. -     nirmatrelvir/ritonavir (PAXLOVID) 20 x 150 MG & 10 x 100MG  TABS; Take 3 tablets by mouth 2 (two) times daily for 5 days. (Take nirmatrelvir 150 mg two tablets twice daily for 5 days and ritonavir 100 mg one tablet twice daily for 5 days) Patient GFR is 88  The patient indicates understanding of these issues and agrees with the plan.   Gabriel Earing, FNP

## 2023-07-08 ENCOUNTER — Other Ambulatory Visit: Payer: Self-pay | Admitting: Family Medicine

## 2023-11-03 ENCOUNTER — Other Ambulatory Visit: Payer: Self-pay | Admitting: Family Medicine

## 2023-11-10 ENCOUNTER — Other Ambulatory Visit: Payer: Self-pay | Admitting: Family Medicine

## 2023-11-11 ENCOUNTER — Encounter: Payer: Self-pay | Admitting: Family Medicine

## 2023-11-11 NOTE — Telephone Encounter (Signed)
Dettinger NTBS last OV 11/03/22 NO RF sent to pharmacy last OV greater than a year

## 2023-11-11 NOTE — Telephone Encounter (Signed)
LEFT MESSAGE TO MAKE APPOINTMENT TO GET MEDS REFILLED

## 2023-12-04 ENCOUNTER — Encounter: Payer: Self-pay | Admitting: Family Medicine

## 2023-12-04 ENCOUNTER — Ambulatory Visit: Payer: Commercial Managed Care - PPO | Admitting: Family Medicine

## 2023-12-04 VITALS — BP 132/81 | HR 76 | Temp 98.3°F | Ht 67.0 in | Wt 261.0 lb

## 2023-12-04 DIAGNOSIS — I10 Essential (primary) hypertension: Secondary | ICD-10-CM

## 2023-12-04 DIAGNOSIS — K219 Gastro-esophageal reflux disease without esophagitis: Secondary | ICD-10-CM

## 2023-12-04 DIAGNOSIS — E785 Hyperlipidemia, unspecified: Secondary | ICD-10-CM

## 2023-12-04 DIAGNOSIS — Z0001 Encounter for general adult medical examination with abnormal findings: Secondary | ICD-10-CM

## 2023-12-04 DIAGNOSIS — Z0000 Encounter for general adult medical examination without abnormal findings: Secondary | ICD-10-CM

## 2023-12-04 MED ORDER — LISINOPRIL-HYDROCHLOROTHIAZIDE 10-12.5 MG PO TABS
1.0000 | ORAL_TABLET | Freq: Every day | ORAL | 3 refills | Status: AC
Start: 2023-12-04 — End: ?

## 2023-12-04 MED ORDER — OMEPRAZOLE 20 MG PO CPDR
20.0000 mg | DELAYED_RELEASE_CAPSULE | Freq: Every day | ORAL | 3 refills | Status: AC
Start: 2023-12-04 — End: ?

## 2023-12-04 MED ORDER — ROSUVASTATIN CALCIUM 5 MG PO TABS
5.0000 mg | ORAL_TABLET | Freq: Every day | ORAL | 3 refills | Status: AC
Start: 2023-12-04 — End: ?

## 2023-12-04 NOTE — Progress Notes (Signed)
BP 132/81   Pulse 76   Temp 98.3 F (36.8 C)   Ht 5\' 7"  (1.702 m)   Wt 261 lb (118.4 kg)   SpO2 97%   BMI 40.88 kg/m    Subjective:   Patient ID: William Mack, male    DOB: Jan 26, 1981, 43 y.o.   MRN: [id number]  HPI: William Mack is a 43 y.o. male presenting on 12/04/2023 for Medical Management of Chronic Issues, Hypertension, and Hyperlipidemia   HPI Physical exam Patient denies any chest pain, shortness of breath, headaches or vision issues, abdominal complaints, diarrhea, nausea, vomiting, or joint issues.   Hypertension Patient is currently on lisinopril-hydrochlorothiazide, and their blood pressure today is 132/81. Patient denies any lightheadedness or dizziness. Patient denies headaches, blurred vision, chest pains, shortness of breath, or weakness. Denies any side effects from medication and is content with current medication.   Hyperlipidemia Patient is coming in for recheck of his hyperlipidemia. The patient is currently taking Crestor and atorvastatin, will change it to just [facility]. They deny any issues with myalgias or history of liver damage from it. They deny any focal numbness or weakness or chest pain.   GERD Patient is currently on omeprazole.  She denies any major symptoms or abdominal pain or belching or burping. She denies any blood in her stool or lightheadedness or dizziness.   Relevant past medical, surgical, family and social history reviewed and updated as indicated. Interim medical history since our last visit reviewed. Allergies and medications reviewed and updated.  Review of Systems  Constitutional:  Negative for chills and fever.  Eyes:  Negative for visual disturbance.  Respiratory:  Negative for shortness of breath and wheezing.   Cardiovascular:  Negative for chest pain and leg swelling.  Musculoskeletal:  Negative for back pain and gait problem.  Skin:  Negative for rash.  Neurological:  Negative for dizziness and light-headedness.   All other systems reviewed and are negative.   Per HPI unless specifically indicated above   Allergies as of 12/04/2023   No Known Allergies      Medication List        Accurate as of December 04, 2023  4:31 PM. If you have any questions, ask your nurse or doctor.          STOP taking these medications    atorvastatin 20 MG tablet Commonly known as: LIPITOR Stopped by: Elige Radon Earnestene Angello   Chlorphen-PE-Acetaminophen 4-10-325 MG Tabs Stopped by: Elige Radon Racheal Mathurin       TAKE these medications    cyclobenzaprine 10 MG tablet Commonly known as: FLEXERIL Take 1 tablet (10 mg total) by mouth 3 (three) times daily as needed for muscle spasms.   lisinopril-hydrochlorothiazide 10-12.5 MG tablet Commonly known as: ZESTORETIC Take 1 tablet by mouth daily.   meclizine 25 MG tablet Commonly known as: ANTIVERT TAKE 1 TABLET BY MOUTH THREE TIMES DAILY AS NEEDED FOR DIZZINESS   omeprazole 20 MG capsule Commonly known as: PRILOSEC Take 1 capsule (20 mg total) by mouth daily.   rosuvastatin 5 MG tablet Commonly known as: Crestor Take 1 tablet (5 mg total) by mouth daily.         Objective:   BP 132/81   Pulse 76   Temp 98.3 F (36.8 C)   Ht 5\' 7"  (1.702 m)   Wt 261 lb (118.4 kg)   SpO2 97%   BMI 40.88 kg/m   Wt Readings from Last 3 Encounters:  12/04/23 261 lb (118.4  kg)  05/25/23 256 lb (116.1 kg)  11/03/22 252 lb (114.3 kg)    Physical Exam Vitals and nursing note reviewed.  Constitutional:      General: He is not in acute distress.    Appearance: He is well-developed. He is not diaphoretic.  Eyes:     General: No scleral icterus.    Conjunctiva/sclera: Conjunctivae normal.  Neck:     Thyroid: No thyromegaly.  Cardiovascular:     Rate and Rhythm: Normal rate and regular rhythm.     Heart sounds: Normal heart sounds. No murmur heard. Pulmonary:     Effort: Pulmonary effort is normal. No respiratory distress.     Breath sounds: Normal breath  sounds. No wheezing.  Musculoskeletal:        General: No swelling. Normal range of motion.     Cervical back: Neck supple.  Lymphadenopathy:     Cervical: No cervical adenopathy.  Skin:    General: Skin is warm and dry.     Findings: No rash.  Neurological:     Mental Status: He is alert and oriented to person, place, and time.     Coordination: Coordination normal.  Psychiatric:        Behavior: Behavior normal.       Assessment & Plan:   Problem List Items Addressed This Visit       Cardiovascular and Mediastinum   Essential hypertension   Relevant Medications   lisinopril-hydrochlorothiazide (ZESTORETIC) 10-12.5 MG tablet   rosuvastatin (CRESTOR) 5 MG tablet   Other Relevant Orders   CBC with Differential/Platelet   CMP14+EGFR   Lipid panel     Digestive   GERD (gastroesophageal reflux disease)   Relevant Medications   omeprazole (PRILOSEC) 20 MG capsule   Other Relevant Orders   CBC with Differential/Platelet   CMP14+EGFR   Lipid panel     Other   Hyperlipidemia LDL goal <130   Relevant Medications   lisinopril-hydrochlorothiazide (ZESTORETIC) 10-12.5 MG tablet   rosuvastatin (CRESTOR) 5 MG tablet   Other Relevant Orders   CBC with Differential/Platelet   CMP14+EGFR   Lipid panel   Other Visit Diagnoses       Physical exam    -  Primary       Continue current medicine, will check blood work today Follow up plan: Return in about 1 year (around 12/03/2024), or if symptoms worsen or fail to improve, for Physical exam and hypertension and hyperlipidemia and GERD.  Counseling provided for all of the vaccine components Orders Placed This Encounter  Procedures   CBC with Differential/Platelet   CMP14+EGFR   Lipid panel    Arville Care, MD [facility] 12/04/2023, 4:31 PM

## 2023-12-05 LAB — CBC WITH DIFFERENTIAL/PLATELET
Basophils Absolute: 0.1 10*3/uL (ref 0.0–0.2)
Basos: 1 %
EOS (ABSOLUTE): 0.2 10*3/uL (ref 0.0–0.4)
Eos: 1 %
Hematocrit: 47.6 % (ref 37.5–51.0)
Hemoglobin: 15.6 g/dL (ref 13.0–17.7)
Immature Grans (Abs): 0 10*3/uL (ref 0.0–0.1)
Immature Granulocytes: 0 %
Lymphocytes Absolute: 3.6 10*3/uL — ABNORMAL HIGH (ref 0.7–3.1)
Lymphs: 32 %
MCH: 32 pg (ref 26.6–33.0)
MCHC: 32.8 g/dL (ref 31.5–35.7)
MCV: 98 fL — ABNORMAL HIGH (ref 79–97)
Monocytes Absolute: 0.7 10*3/uL (ref 0.1–0.9)
Monocytes: 7 %
Neutrophils Absolute: 6.6 10*3/uL (ref 1.4–7.0)
Neutrophils: 59 %
Platelets: 304 10*3/uL (ref 150–450)
RBC: 4.87 x10E6/uL (ref 4.14–5.80)
RDW: 13.2 % (ref 11.6–15.4)
WBC: 11.2 10*3/uL — ABNORMAL HIGH (ref 3.4–10.8)

## 2023-12-05 LAB — CMP14+EGFR
ALT: 46 [IU]/L — ABNORMAL HIGH (ref 0–44)
AST: 32 [IU]/L (ref 0–40)
Albumin: 4.7 g/dL (ref 4.1–5.1)
Alkaline Phosphatase: 50 [IU]/L (ref 44–121)
BUN/Creatinine Ratio: 17 (ref 9–20)
BUN: 20 mg/dL (ref 6–24)
Bilirubin Total: 0.3 mg/dL (ref 0.0–1.2)
CO2: 24 mmol/L (ref 20–29)
Calcium: 9.8 mg/dL (ref 8.7–10.2)
Chloride: 100 mmol/L (ref 96–106)
Creatinine, Ser: 1.21 mg/dL (ref 0.76–1.27)
Globulin, Total: 2.3 g/dL (ref 1.5–4.5)
Glucose: 79 mg/dL (ref 70–99)
Potassium: 4.5 mmol/L (ref 3.5–5.2)
Sodium: 140 mmol/L (ref 134–144)
Total Protein: 7 g/dL (ref 6.0–8.5)
eGFR: 76 mL/min/{1.73_m2} (ref >59)

## 2023-12-05 LAB — LIPID PANEL
Chol/HDL Ratio: 4.2 {ratio} (ref 0.0–5.0)
Cholesterol, Total: 157 mg/dL (ref 100–199)
HDL: 37 mg/dL — ABNORMAL LOW (ref >39)
LDL Chol Calc (NIH): 107 mg/dL — ABNORMAL HIGH (ref 0–99)
Triglycerides: 65 mg/dL (ref 0–149)
VLDL Cholesterol Cal: 13 mg/dL (ref 5–40)

## 2023-12-11 ENCOUNTER — Encounter: Payer: Self-pay | Admitting: Family Medicine

## 2023-12-15 ENCOUNTER — Ambulatory Visit: Payer: Self-pay | Admitting: Family Medicine

## 2023-12-15 NOTE — Telephone Encounter (Signed)
  Chief Complaint: L mid back pain Symptoms: nausea, pain Frequency: this AM Pertinent Negatives: Patient denies fever, CP, SOB, pain/blood with urination Disposition: [] ED /[x] Urgent Care (no appt availability in office) / [] Appointment(In office/virtual)/ []  [facility]/ [] Home Care/ [] Refused Recommended Disposition /[] [facility]/ []  Follow-up with PCP Additional Notes: Pt c/o L mid back pain that started this AM. Reports this pain feel similar to kidney stone in the past which resulted in hospital admission for pain relief. Pt took aleve with minimal results. Denies blood/pain with urination, SOB, CP. Triager offered to schedule appt at alternate [facility] and [facility], but pt declined. Pt states that he will go to UC closest to him for evaluation. Patient verbalized understanding and to call back with worsening symptoms.    Copied from CRM [id number]. Topic: Clinical - Red Word Triage >> Dec 15, 2023  8:21 AM Tiffany B wrote: Red Word that prompted transfer to Nurse Triage: Patient is experiencing severe back pain. Caller states he thinks he has a kidney stone. Reason for Disposition  [1] SEVERE back pain (e.g., excruciating, unable to do any normal activities) AND [2] not improved 2 hours after pain medicine  Answer Assessment - Initial Assessment Questions 1. ONSET: "When did the pain begin?"      3 AM this morning 2. LOCATION: "Where does it hurt?" (upper, mid or lower back)    L  Mid back 3. SEVERITY: "How bad is the pain?"  (e.g., Scale 1-10; mild, moderate, or severe)   - MILD (1-3): Doesn't interfere with normal activities.    - MODERATE (4-7): Interferes with normal activities or awakens from sleep.    - SEVERE (8-10): Excruciating pain, unable to do any normal activities.      5/10 4. PATTERN: "Is the pain constant?" (e.g., yes, no; constant, intermittent)      constant 5. RADIATION: "Does the pain shoot into your legs or somewhere else?"     Radiates to front  of stomach 6. CAUSE:  "What do you think is causing the back pain?"      No recent injury Has had kidney stone in the past - feels similar 7. BACK OVERUSE:  "Any recent lifting of heavy objects, strenuous work or exercise?"     no 8. MEDICINES: "What have you taken so far for the pain?" (e.g., nothing, acetaminophen, NSAIDS)     Aleve - minimal relief 9. NEUROLOGIC SYMPTOMS: "Do you have any weakness, numbness, or problems with bowel/bladder control?"     no 10. OTHER SYMPTOMS: "Do you have any other symptoms?" (e.g., fever, abdomen pain, burning with urination, blood in urine)       No, reports urine is more yellow than usual Some nausea this AM  Protocols used: Back Pain-A-AH

## 2023-12-15 NOTE — Telephone Encounter (Signed)
 Went to urgent care.

## 2024-01-11 ENCOUNTER — Other Ambulatory Visit: Payer: Self-pay | Admitting: Family Medicine

## 2024-04-08 ENCOUNTER — Other Ambulatory Visit: Payer: Self-pay | Admitting: Family Medicine

## 2024-05-10 ENCOUNTER — Other Ambulatory Visit: Payer: Self-pay | Admitting: Nurse Practitioner

## 2024-07-06 ENCOUNTER — Other Ambulatory Visit: Payer: Self-pay | Admitting: Family Medicine

## 2024-08-08 ENCOUNTER — Other Ambulatory Visit: Payer: Self-pay | Admitting: Family Medicine

## 2024-10-03 ENCOUNTER — Other Ambulatory Visit: Payer: Self-pay | Admitting: Family Medicine

## 2024-10-17 ENCOUNTER — Ambulatory Visit: Payer: Self-pay

## 2024-10-17 NOTE — Telephone Encounter (Signed)
Noted  -LS

## 2024-10-17 NOTE — Telephone Encounter (Addendum)
 FYI Only or Action Required?: FYI only for provider: appointment scheduled on 10/18/2024 at 11:50am with Doctor of the Day at PCP office.  Patient was last seen in primary care on 12/04/2023 by Dettinger, Fonda LABOR, MD.  Called Nurse Triage reporting Foot Injury.  Symptoms began several days ago.  Interventions attempted: OTC medications: icy hot, tylenol , Rest, hydration, or home remedies, and Ice/heat application.  Symptoms are: gradually worsening.  Triage Disposition: See Physician Within 24 Hours  Patient/caregiver understands and will follow disposition?: Yes                   Copied from CRM #[id number]. Topic: Clinical - Red Word Triage >> Oct 17, 2024  8:40 AM Graeme ORN wrote: Red Word that prompted transfer to Nurse Triage: swelling foot, fell in hole Thursday and something popped, request xray Reason for Disposition  [1] Limp when walking AND [2] due to a twisting injury of the foot  Answer Assessment - Initial Assessment Questions something pop in his left foot (4 days ago) Swelling and bruising Patient able to walk on it Patient has used icy hot, ice, and taking Tylenol  Pain is a 2 out of 10 at rest and a 4 out of 10 when walking  Appointment made for 10/19/2023 with Doctor of the Day at PCP office Patient is advised to call us  back if anything changes or with any further questions/concerns. Patient is advised that if anything worsens to go to the Emergency Room. Patient verbalized understanding.  Protocols used: Foot Injury-A-AH

## 2024-10-18 ENCOUNTER — Encounter: Payer: Self-pay | Admitting: Family Medicine

## 2024-10-18 ENCOUNTER — Ambulatory Visit: Admitting: Family Medicine

## 2024-10-18 ENCOUNTER — Ambulatory Visit

## 2024-10-18 VITALS — BP 138/87 | HR 75 | Temp 97.1°F | Ht 67.0 in | Wt 258.4 lb

## 2024-10-18 DIAGNOSIS — M79672 Pain in left foot: Secondary | ICD-10-CM

## 2024-10-18 DIAGNOSIS — I10 Essential (primary) hypertension: Secondary | ICD-10-CM

## 2024-10-18 DIAGNOSIS — S93402A Sprain of unspecified ligament of left ankle, initial encounter: Secondary | ICD-10-CM | POA: Diagnosis not present

## 2024-10-18 MED ORDER — PREDNISONE 20 MG PO TABS: 40.0000 mg | ORAL_TABLET | Freq: Every day | ORAL | 0 refills | Status: AC

## 2024-10-18 NOTE — Patient Instructions (Signed)
 Goal BP:  Less than 130/80  Take your medications faithfully as prescribed. Maintain a healthy weight. Get at least 150 minutes of aerobic exercise per week. Minimize salt intake, less than 2000 mg per day. Minimize alcohol intake.  DASH Eating Plan DASH stands for Dietary Approaches to Stop Hypertension. The DASH eating plan is a healthy eating plan that has been shown to reduce high blood pressure (hypertension). Additional health benefits may include reducing the risk of type 2 diabetes mellitus, heart disease, and stroke. The DASH eating plan may also help with weight loss.  WHAT DO I NEED TO KNOW ABOUT THE DASH EATING PLAN? For the DASH eating plan, you will follow these general guidelines: Choose foods with a percent daily value for sodium of less than 5% (as listed on the food label). Use salt-free seasonings or herbs instead of table salt or sea salt. Check with your health care provider or pharmacist before using salt substitutes. Eat lower-sodium products, often labeled as lower sodium or no salt added. Eat fresh foods. Eat more vegetables, fruits, and low-fat dairy products. Choose whole grains. Look for the word whole as the first word in the ingredient list. Choose fish and skinless chicken or [place] more often than red meat. Limit fish, poultry, and meat to 6 oz (170 g) each day. Limit sweets, desserts, sugars, and sugary drinks. Choose heart-healthy fats. Limit cheese to 1 oz (28 g) per day. Eat more home-cooked food and less restaurant, buffet, and fast food. Limit fried foods. Cook foods using methods other than frying. Limit canned vegetables. If you do use them, rinse them well to decrease the sodium. When eating at a restaurant, ask that your food be prepared with less salt, or no salt if possible.  WHAT FOODS CAN I EAT? Seek help from a dietitian for individual calorie needs.  Grains Whole grain or whole wheat bread. Hoshino rice. Whole grain or whole  wheat pasta. Quinoa, bulgur, and whole grain cereals. Low-sodium cereals. Corn or whole wheat flour tortillas. Whole grain cornbread. Whole grain crackers. Low-sodium crackers.  Vegetables Fresh or frozen vegetables (raw, steamed, roasted, or grilled). Low-sodium or reduced-sodium tomato and vegetable juices. Low-sodium or reduced-sodium tomato sauce and paste. Low-sodium or reduced-sodium canned vegetables.   Fruits All fresh, canned (in natural juice), or frozen fruits.  Meat and Other Protein Products Ground beef (85% or leaner), grass-fed beef, or beef trimmed of fat. Skinless chicken or [place]. Ground chicken or [place]. Pork trimmed of fat. All fish and seafood. Eggs. Dried beans, peas, or lentils. Unsalted nuts and seeds. Unsalted canned beans.  Dairy Low-fat dairy products, such as skim or 1% milk, 2% or reduced-fat cheeses, low-fat ricotta or cottage cheese, or plain low-fat yogurt. Low-sodium or reduced-sodium cheeses.  Fats and Oils Tub margarines without trans fats. Light or reduced-fat mayonnaise and salad dressings (reduced sodium). Avocado. Safflower, olive, or canola oils. Natural peanut or almond butter.  Other Unsalted popcorn and pretzels. The items listed above may not be a complete list of recommended foods or beverages. Contact your dietitian for more options.  WHAT FOODS ARE NOT RECOMMENDED?  Grains White bread. White pasta. White rice. Refined cornbread. Bagels and croissants. Crackers that contain trans fat.  Vegetables Creamed or fried vegetables. Vegetables in a cheese sauce. Regular canned vegetables. Regular canned tomato sauce and paste. Regular tomato and vegetable juices.  Fruits Dried fruits. Canned fruit in light or heavy syrup. Fruit juice.  Meat and Other Protein Products Fatty cuts of meat. Ribs,  chicken wings, bacon, sausage, bologna, salami, chitterlings, fatback, hot dogs, bratwurst, and packaged luncheon meats. Salted nuts and seeds. Canned  beans with salt.  Dairy Whole or 2% milk, cream, half-and-half, and cream cheese. Whole-fat or sweetened yogurt. Full-fat cheeses or blue cheese. Nondairy creamers and whipped toppings. Processed cheese, cheese spreads, or cheese curds.  Condiments Onion and garlic salt, seasoned salt, table salt, and sea salt. Canned and packaged gravies. Worcestershire sauce. Tartar sauce. Barbecue sauce. Teriyaki sauce. Soy sauce, including reduced sodium. Steak sauce. Fish sauce. Oyster sauce. Cocktail sauce. Horseradish. Ketchup and mustard. Meat flavorings and tenderizers. Bouillon cubes. Hot sauce. Tabasco sauce. Marinades. Taco seasonings. Relishes.  Fats and Oils Butter, stick margarine, lard, shortening, ghee, and bacon fat. Coconut, palm kernel, or palm oils. Regular salad dressings.  Other Pickles and olives. Salted popcorn and pretzels.  The items listed above may not be a complete list of foods and beverages to avoid. Contact your dietitian for more information.  WHERE CAN I FIND MORE INFORMATION? National Heart, [employer], and [employer] Institute: [employer] Document Released: 09/18/2011 Document Revised: 02/13/2014 Document Reviewed: 08/03/2013 [facility] Patient Information 2015 [place], [place]. This information is not intended to replace advice given to you by your health care provider. Make sure you discuss any questions you have with your health care provider.   I think that you would greatly benefit from seeing a nutritionist.  If you are interested, please call Dr. Wonda at [phone] to schedule an appointment.

## 2024-10-18 NOTE — Progress Notes (Signed)
 "    Subjective:  Patient ID: William Mack, male    DOB: June 04, 1981, 44 y.o.   MRN: [id number]  Patient Care Team: Dettinger, Fonda LABOR, MD as PCP - General (Family Medicine)   Chief Complaint:  Foot Pain (Left foot pain after falling in hole on Thursday night )   HPI: William Mack is a 44 y.o. male presenting on 10/18/2024 for Foot Pain (Left foot pain after falling in hole on Thursday night )    William Mack is a 44 year old male with hypertension who presents with elevated blood pressure and a foot injury.  He has elevated blood pressure, with a recent home measurement of 138/87 mmHg. He does not regularly monitor his blood pressure at home. No chest pain, leg swelling, shortness of breath, or headaches. He confirms taking his blood pressure medication this morning.  He describes a foot injury that occurred while running across the porch and down the steps, resulting in a fall. He heard a 'pop' and experienced soreness and swelling in the foot by Friday night. He is able to walk on it but finds it uncomfortable and cannot put full weight on it. He has not been using any specific treatment for the foot injury.          Relevant past medical, surgical, family, and social history reviewed and updated as indicated.  Allergies and medications reviewed and updated. Data reviewed: Chart in Epic.   Past Medical History:  Diagnosis Date   Dizziness and giddiness 11/01/2015   GERD (gastroesophageal reflux disease)    Hypertension    Kidney stones 06/01/2015    Past Surgical History:  Procedure Laterality Date   None      Social History   Socioeconomic History   Marital status: Married    Spouse name: Not on file   Number of children: 2   Years of education: 9   Highest education level: Not on file  Occupational History   Not on file  Tobacco Use   Smoking status: Never   Smokeless tobacco: Never  Substance and Sexual Activity   Alcohol use: No     Alcohol/week: 0.0 standard drinks of alcohol   Drug use: No   Sexual activity: Not on file  Other Topics Concern   Not on file  Social History Narrative   Patient no longer drinks caffeine.   Social Drivers of Health   Tobacco Use: Low Risk (10/18/2024)   Patient History    Smoking Tobacco Use: Never    Smokeless Tobacco Use: Never    Passive Exposure: Not on file  Financial Resource Strain: Not on file  Food Insecurity: Not on file  Transportation Needs: Not on file  Physical Activity: Not on file  Stress: Not on file  Social Connections: Not on file  Intimate Partner Violence: Not on file  Depression (PHQ2-9): Low Risk (12/04/2023)   Depression (PHQ2-9)    PHQ-2 Score: 0  Alcohol Screen: Not on file  Housing: Not on file  Utilities: Not on file  Health Literacy: Not on file    Outpatient Encounter Medications as of 10/18/2024  Medication Sig   cyclobenzaprine  (FLEXERIL ) 10 MG tablet Take 1 tablet (10 mg total) by mouth 3 (three) times daily as needed for muscle spasms.   lisinopril -hydrochlorothiazide  (ZESTORETIC ) 10-12.5 MG tablet Take 1 tablet by mouth daily.   meclizine  (ANTIVERT ) 25 MG tablet TAKE 1 TABLET BY MOUTH THREE TIMES DAILY AS NEEDED FOR DIZZINESS  omeprazole  (PRILOSEC) 20 MG capsule Take 1 capsule (20 mg total) by mouth daily.   predniSONE  (DELTASONE ) 20 MG tablet Take 2 tablets (40 mg total) by mouth daily with breakfast for 5 days.   rosuvastatin  (CRESTOR ) 5 MG tablet Take 1 tablet (5 mg total) by mouth daily.   No facility-administered encounter medications on file as of 10/18/2024.    Allergies[1]  Pertinent ROS per HPI, otherwise unremarkable      Objective:  BP 138/87 Comment: home reading  Pulse 75   Temp (!) 97.1 F (36.2 C)   Ht 5' 7 (1.702 m)   Wt 258 lb 6.4 oz (117.2 kg)   SpO2 96%   BMI 40.47 kg/m    Wt Readings from Last 3 Encounters:  10/18/24 258 lb 6.4 oz (117.2 kg)  12/04/23 261 lb (118.4 kg)  05/25/23 256 lb (116.1 kg)     Physical Exam Vitals and nursing note reviewed.  Constitutional:      General: He is not in acute distress.    Appearance: Normal appearance. He is obese. He is not ill-appearing, toxic-appearing or diaphoretic.  HENT:     Head: Normocephalic and atraumatic.     Nose: Nose normal.     Mouth/Throat:     Mouth: Mucous membranes are moist.  Eyes:     Pupils: Pupils are equal, round, and reactive to light.  Cardiovascular:     Rate and Rhythm: Normal rate and regular rhythm.     Pulses: Normal pulses.     Heart sounds: Normal heart sounds.  Pulmonary:     Breath sounds: Normal breath sounds.  Musculoskeletal:     Left lower leg: Normal.     Right ankle: Normal.     Left ankle: Swelling and ecchymosis present. No deformity or lacerations. No tenderness. Normal range of motion. Anterior drawer test negative. Normal pulse.     Left Achilles Tendon: Normal. No tenderness or defects. Thompson's test negative.     Left foot: Normal range of motion and normal capillary refill. Swelling present. No deformity, bunion, Charcot foot, foot drop, prominent metatarsal heads, laceration, tenderness, bony tenderness or crepitus. Normal pulse.       Legs:  Skin:    General: Skin is warm and dry.     Capillary Refill: Capillary refill takes less than 2 seconds.  Neurological:     General: No focal deficit present.     Mental Status: He is alert and oriented to person, place, and time.  Psychiatric:        Mood and Affect: Mood normal.        Behavior: Behavior normal.        Thought Content: Thought content normal.        Judgment: Judgment normal.    Physical Exam   MUSCULOSKELETAL: Ecchymosis on medial ankle. Pain in medial ankle with downward pressure of foot. No pain on inversion or eversion of foot. Able to ambulate with slight discomfort.      X-Ray: left foot / ankle : No acute findings. Preliminary x-ray reading by Rosaline Bruns, FNP-C, [facility].   Results for orders placed or  performed in visit on 12/04/23  CBC with Differential/Platelet   Collection Time: 12/04/23  4:23 PM  Result Value Ref Range   WBC 11.2 (H) 3.4 - 10.8 x10E3/uL   RBC 4.87 4.14 - 5.80 x10E6/uL   Hemoglobin 15.6 13.0 - 17.7 g/dL   Hematocrit 52.3 62.4 - 51.0 %   MCV 98 (H) 79 -  97 fL   MCH 32.0 26.6 - 33.0 pg   MCHC 32.8 31.5 - 35.7 g/dL   RDW 86.7 88.3 - 84.5 %   Platelets 304 150 - 450 x10E3/uL   Neutrophils 59 Not Estab. %   Lymphs 32 Not Estab. %   Monocytes 7 Not Estab. %   Eos 1 Not Estab. %   Basos 1 Not Estab. %   Neutrophils Absolute 6.6 1.4 - 7.0 x10E3/uL   Lymphocytes Absolute 3.6 (H) 0.7 - 3.1 x10E3/uL   Monocytes Absolute 0.7 0.1 - 0.9 x10E3/uL   EOS (ABSOLUTE) 0.2 0.0 - 0.4 x10E3/uL   Basophils Absolute 0.1 0.0 - 0.2 x10E3/uL   Immature Granulocytes 0 Not Estab. %   Immature Grans (Abs) 0.0 0.0 - 0.1 x10E3/uL  CMP14+EGFR   Collection Time: 12/04/23  4:23 PM  Result Value Ref Range   Glucose 79 70 - 99 mg/dL   BUN 20 6 - 24 mg/dL   Creatinine, Ser 8.78 0.76 - 1.27 mg/dL   eGFR 76 >40 fO/fpw/8.26   BUN/Creatinine Ratio 17 9 - 20   Sodium 140 134 - 144 mmol/L   Potassium 4.5 3.5 - 5.2 mmol/L   Chloride 100 96 - 106 mmol/L   CO2 24 20 - 29 mmol/L   Calcium  9.8 8.7 - 10.2 mg/dL   Total Protein 7.0 6.0 - 8.5 g/dL   Albumin 4.7 4.1 - 5.1 g/dL   Globulin, Total 2.3 1.5 - 4.5 g/dL   Bilirubin Total 0.3 0.0 - 1.2 mg/dL   Alkaline Phosphatase 50 44 - 121 IU/L   AST 32 0 - 40 IU/L   ALT 46 (H) 0 - 44 IU/L  Lipid panel   Collection Time: 12/04/23  4:23 PM  Result Value Ref Range   Cholesterol, Total 157 100 - 199 mg/dL   Triglycerides 65 0 - 149 mg/dL   HDL 37 (L) >60 mg/dL   VLDL Cholesterol Cal 13 5 - 40 mg/dL   LDL Chol Calc (NIH) 892 (H) 0 - 99 mg/dL   Chol/HDL Ratio 4.2 0.0 - 5.0 ratio       Pertinent labs & imaging results that were available during my care of the patient were reviewed by me and considered in my medical decision making.  Assessment &  Plan:  Darshawn was seen today for foot pain.  Diagnoses and all orders for this visit:  Sprain of left ankle, unspecified ligament, initial encounter -     predniSONE  (DELTASONE ) 20 MG tablet; Take 2 tablets (40 mg total) by mouth daily with breakfast for 5 days.  Left foot pain -     DG Foot Complete Left; Future -     predniSONE  (DELTASONE ) 20 MG tablet; Take 2 tablets (40 mg total) by mouth daily with breakfast for 5 days.  Essential hypertension       Sprain of left ankle Acute left ankle sprain likely involving the medial ligament. No acute findings on x-ray. Differential includes possible fracture not visible on x-ray. Pain and swelling present, but able to ambulate with discomfort. NSAIDs avoided due to elevated blood pressure. - Applied ASO wrap for compression. - Instructed on RICE therapy: rest, ice, elevate, compress. - Prescribed prednisone  for anti-inflammatory effect, to be taken every morning for five days. - Advised to wear ASO wrap for two weeks, removing it for sleep and showering. - Instructed to report if pain worsens or new symptoms develop. - Will refer to orthopedics if not significantly improved in two  weeks. - Will order additional imaging, such as MRI, if radiologist finds suspicious findings.  Essential hypertension Blood pressure elevated at 138/87 mmHg. No symptoms such as chest pain, leg swelling, shortness of breath, or headaches reported. Blood pressure management is crucial due to potential impact on treatment options for ankle sprain. - Monitor blood pressure at home. - Report if blood pressure consistently exceeds 130/80 mmHg.          Continue all other maintenance medications.  Follow up plan: Return in 2 weeks (on 11/01/2024), or if symptoms worsen or fail to improve.   Continue healthy lifestyle choices, including diet (rich in fruits, vegetables, and lean proteins, and low in salt and simple carbohydrates) and exercise (at least 30  minutes of moderate physical activity daily).  Educational handout given for DASH diet, HTN, ankle sprain  The above assessment and management plan was discussed with the patient. The patient verbalized understanding of and has agreed to the management plan. Patient is aware to call the clinic if they develop any new symptoms or if symptoms persist or worsen. Patient is aware when to return to the clinic for a follow-up visit. Patient educated on when it is appropriate to go to the emergency department.   Rosaline Bruns, FNP-C [facility] [phone]     [1] No Known Allergies  "

## 2024-10-26 ENCOUNTER — Ambulatory Visit: Payer: Self-pay | Admitting: Family Medicine
